# Patient Record
Sex: Female | Born: 1995 | Race: Black or African American | Hispanic: No | Marital: Single | Smoking: Former smoker
Health system: Southern US, Community
[De-identification: ages and names within clinical notes are randomized; demographics above are authoritative.]

## PROBLEM LIST (undated history)

## (undated) ENCOUNTER — Emergency Department (HOSPITAL_COMMUNITY): Payer: No Typology Code available for payment source | Source: Home / Self Care

## (undated) DIAGNOSIS — F419 Anxiety disorder, unspecified: Secondary | ICD-10-CM

## (undated) DIAGNOSIS — F329 Major depressive disorder, single episode, unspecified: Secondary | ICD-10-CM

## (undated) DIAGNOSIS — F32A Depression, unspecified: Secondary | ICD-10-CM

## (undated) HISTORY — PX: WISDOM TOOTH EXTRACTION: SHX21

## (undated) HISTORY — PX: TONSILLECTOMY: SUR1361

---

## 2017-01-14 ENCOUNTER — Ambulatory Visit (INDEPENDENT_AMBULATORY_CARE_PROVIDER_SITE_OTHER): Payer: BLUE CROSS/BLUE SHIELD

## 2017-01-14 ENCOUNTER — Ambulatory Visit (HOSPITAL_COMMUNITY)
Admission: EM | Admit: 2017-01-14 | Discharge: 2017-01-14 | Disposition: A | Discharge status: Home / Self Care | Payer: BLUE CROSS/BLUE SHIELD | Attending: Family Medicine | Admitting: Family Medicine

## 2017-01-14 ENCOUNTER — Encounter (HOSPITAL_COMMUNITY): Payer: Self-pay | Admitting: Emergency Medicine

## 2017-01-14 DIAGNOSIS — S96911A Strain of unspecified muscle and tendon at ankle and foot level, right foot, initial encounter: Secondary | ICD-10-CM | POA: Diagnosis not present

## 2017-01-14 DIAGNOSIS — S93401A Sprain of unspecified ligament of right ankle, initial encounter: Secondary | ICD-10-CM

## 2017-01-14 HISTORY — DX: Anxiety disorder, unspecified: F41.9

## 2017-01-14 HISTORY — DX: Major depressive disorder, single episode, unspecified: F32.9

## 2017-01-14 HISTORY — DX: Depression, unspecified: F32.A

## 2017-01-14 NOTE — ED Triage Notes (Signed)
Pt reports she jammed her 5th digit on right toe yest against a metal baby high chair   Sx include: swelling an dpain  Slow gait... A&O x4... NAD... Ambulatory

## 2017-01-14 NOTE — Discharge Instructions (Signed)
Rest, apply ice to the toe and ankle for the next 2-3 days, wear the wrap to the ankle for 3 or 4 days and limit weightbearing. Keep the little toe buddy taped to the fourth toe for couple of days. No apparent broken bones on x-ray.

## 2017-01-14 NOTE — ED Provider Notes (Signed)
CSN: [id number]     Arrival date & time 01/14/17  1354 History   First MD Initiated Contact with Patient 01/14/17 1410     Chief Complaint  Patient presents with  . Foot Pain   (Consider location/radiation/quality/duration/timing/severity/associated sxs/prior Treatment) 21 year old female was walking in her home last night where she caught her fifth toe on some object and "overstretched it". This caused her to fall twisting her right ankle. She did not have pain to the ankle at that time however several hours later she awoke early in the morning with pain and swelling to the right ankle. She has been ambulating today.      Past Medical History:  Diagnosis Date  . Anxiety   . Depression    History reviewed. No pertinent surgical history. History reviewed. No pertinent family history. Social History  Substance Use Topics  . Smoking status: Current Every Day Smoker    Types: Cigarettes  . Smokeless tobacco: Never Used  . Alcohol use No   OB History    No data available     Review of Systems  Constitutional: Negative.  Negative for activity change, chills and fever.  HENT: Negative.   Respiratory: Negative.   Cardiovascular: Negative.   Musculoskeletal:       As per HPI  Skin: Negative for color change, pallor and rash.  Neurological: Negative.   All other systems reviewed and are negative.   Allergies  Latex  Home Medications   Prior to Admission medications   Not on File   Meds Ordered and Administered this Visit  Medications - No data to display  BP 122/73 (BP Location: Right Arm)   Pulse 77   Temp 98.5 F (36.9 C) (Oral)   Resp 16   SpO2 100%  No data found.   Physical Exam  Constitutional: She is oriented to person, place, and time. She appears well-developed and well-nourished. No distress.  HENT:  Head: Normocephalic and atraumatic.  Eyes: EOM are normal.  Neck: Normal range of motion. Neck supple.  Musculoskeletal: Normal range of motion.   Minor swelling to the ankle primarily medial aspect. Tenderness just distal to the medial malleolus. Minor swelling to the foot. No tenderness to the foot. No deformity. Tenderness to the IP joint of the fifth digit. No deformity or appreciable swelling. Pedal pulse 2+. Distal neurovascular motor sensory is grossly intact. No injury to the skin or apparent wounds.  Lymphadenopathy:    She has no cervical adenopathy.  Neurological: She is alert and oriented to person, place, and time. No cranial nerve deficit.  Skin: Skin is warm and dry.  Psychiatric: She has a normal mood and affect.  Nursing note and vitals reviewed.   Urgent Care Course     Procedures (including critical care time)  Labs Review Labs Reviewed - No data to display  Imaging Review Dg Foot Complete Right  Result Date: 01/14/2017 CLINICAL DATA:  21 year old female status post trip and fall radiating from the right fifth toe to the lateral foot and medial ankle. EXAM: RIGHT FOOT COMPLETE - 3+ VIEW COMPARISON:  None. FINDINGS: Bone mineralization is within normal limits. Calcaneus is intact. Grossly normal alignment at the visible right ankle. Tarsal bones appear intact and normally aligned. Metatarsals appear intact and normally aligned. Phalanges appear intact. No fifth phalanx fracture or dislocation identified. IMPRESSION: No acute fracture or dislocation identified about the right foot. If there is right ankle point tenderness then a dedicated right ankle series is recommended. Electronically  Signed   By: Odessa Fleming M.D.   On: 01/14/2017 14:48     Visual Acuity Review  Right Eye Distance:   Left Eye Distance:   Bilateral Distance:    Right Eye Near:   Left Eye Near:    Bilateral Near:         MDM   1. Sprain of right ankle, unspecified ligament, initial encounter   2. Strain of fifth toe of right foot, initial encounter    Rest, apply ice to the toe and ankle for the next 2-3 days, wear the wrap to the  ankle for 3 or 4 days and limit weightbearing. Keep the little toe buddy taped to the fourth toe for couple of days. No apparent broken bones on x-ray. ACE and toe buddy tape    Hayden Rasmussen, NP 01/14/17 1510

## 2017-12-05 ENCOUNTER — Emergency Department (HOSPITAL_BASED_OUTPATIENT_CLINIC_OR_DEPARTMENT_OTHER)
Admission: EM | Admit: 2017-12-05 | Discharge: 2017-12-06 | Disposition: A | Discharge status: Home / Self Care | Payer: BLUE CROSS/BLUE SHIELD | Attending: Emergency Medicine | Admitting: Emergency Medicine

## 2017-12-05 ENCOUNTER — Other Ambulatory Visit: Payer: Self-pay

## 2017-12-05 ENCOUNTER — Encounter (HOSPITAL_BASED_OUTPATIENT_CLINIC_OR_DEPARTMENT_OTHER): Payer: Self-pay | Admitting: *Deleted

## 2017-12-05 DIAGNOSIS — Y998 Other external cause status: Secondary | ICD-10-CM | POA: Diagnosis not present

## 2017-12-05 DIAGNOSIS — Z5321 Procedure and treatment not carried out due to patient leaving prior to being seen by health care provider: Secondary | ICD-10-CM | POA: Diagnosis not present

## 2017-12-05 DIAGNOSIS — Y92410 Unspecified street and highway as the place of occurrence of the external cause: Secondary | ICD-10-CM | POA: Diagnosis not present

## 2017-12-05 DIAGNOSIS — M549 Dorsalgia, unspecified: Secondary | ICD-10-CM | POA: Insufficient documentation

## 2017-12-05 DIAGNOSIS — Y939 Activity, unspecified: Secondary | ICD-10-CM | POA: Diagnosis not present

## 2017-12-05 DIAGNOSIS — M25511 Pain in right shoulder: Secondary | ICD-10-CM | POA: Diagnosis not present
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
## 2017-12-05 NOTE — ED Notes (Signed)
EDP into room, pt not in room, pt left.

## 2017-12-05 NOTE — ED Notes (Addendum)
Wait, plan, process explained with rationale. Pt speaking of leaving. Encouraged to stay. Pt alert, NAD, calm, interactive, resps e/u, speaking in clear complete sentences, no dyspnea noted.
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
## 2018-06-05 ENCOUNTER — Emergency Department (HOSPITAL_COMMUNITY)
Admission: EM | Admit: 2018-06-05 | Discharge: 2018-06-05 | Disposition: A | Discharge status: Home / Self Care | Payer: BLUE CROSS/BLUE SHIELD | Attending: Emergency Medicine | Admitting: Emergency Medicine

## 2018-06-05 ENCOUNTER — Emergency Department (HOSPITAL_COMMUNITY): Payer: BLUE CROSS/BLUE SHIELD

## 2018-06-05 ENCOUNTER — Other Ambulatory Visit: Payer: Self-pay

## 2018-06-05 ENCOUNTER — Encounter (HOSPITAL_COMMUNITY): Payer: Self-pay

## 2018-06-05 DIAGNOSIS — R0789 Other chest pain: Secondary | ICD-10-CM | POA: Diagnosis not present

## 2018-06-05 DIAGNOSIS — F17210 Nicotine dependence, cigarettes, uncomplicated: Secondary | ICD-10-CM | POA: Insufficient documentation

## 2018-06-05 DIAGNOSIS — R05 Cough: Secondary | ICD-10-CM | POA: Insufficient documentation

## 2018-06-05 DIAGNOSIS — Z91040 Latex allergy status: Secondary | ICD-10-CM | POA: Diagnosis not present

## 2018-06-05 DIAGNOSIS — R0602 Shortness of breath: Secondary | ICD-10-CM | POA: Diagnosis not present

## 2018-06-05 DIAGNOSIS — R079 Chest pain, unspecified: Secondary | ICD-10-CM

## 2018-06-05 LAB — BASIC METABOLIC PANEL
Anion gap: 7 (ref 5–15)
BUN: 11 mg/dL (ref 6–20)
CHLORIDE: 111 mmol/L (ref 98–111)
CO2: 25 mmol/L (ref 22–32)
CREATININE: 0.82 mg/dL (ref 0.44–1.00)
Calcium: 9.5 mg/dL (ref 8.9–10.3)
Glucose, Bld: 118 mg/dL — ABNORMAL HIGH (ref 70–99)
Potassium: 3.9 mmol/L (ref 3.5–5.1)
SODIUM: 143 mmol/L (ref 135–145)

## 2018-06-05 LAB — CBC
HCT: 41.9 % (ref 36.0–46.0)
Hemoglobin: 14.2 g/dL (ref 12.0–15.0)
MCH: 31.3 pg (ref 26.0–34.0)
MCHC: 33.9 g/dL (ref 30.0–36.0)
MCV: 92.5 fL (ref 78.0–100.0)
Platelets: 326 10*3/uL (ref 150–400)
RBC: 4.53 MIL/uL (ref 3.87–5.11)
RDW: 12.9 % (ref 11.5–15.5)
WBC: 11.3 10*3/uL — AB (ref 4.0–10.5)

## 2018-06-05 LAB — POCT I-STAT TROPONIN I: TROPONIN I, POC: 0 ng/mL (ref 0.00–0.08)

## 2018-06-05 LAB — I-STAT BETA HCG BLOOD, ED (NOT ORDERABLE)

## 2018-06-05 MED ORDER — KETOROLAC TROMETHAMINE 60 MG/2ML IM SOLN
60.0000 mg | Freq: Once | INTRAMUSCULAR | Status: AC
  Administered 2018-06-05: 60 mg via INTRAMUSCULAR
  Filled 2018-06-05: qty 2

## 2018-06-05 MED ORDER — IBUPROFEN 800 MG PO TABS: 800.0000 mg | ORAL_TABLET | Freq: Three times a day (TID) | ORAL | 0 refills | Status: DC

## 2018-06-05 NOTE — ED Triage Notes (Signed)
Pt states that she has been having chest pain since last night "all over her chest. Pt describes pain as "achy". Pt denies musculoskeletal issues.

## 2018-06-05 NOTE — ED Provider Notes (Signed)
Emergency Department Provider Note   I have reviewed the triage vital signs and the nursing notes.   HISTORY  Chief Complaint Chest Pain   HPI Abigail Erickson is a 22 y.o. female with left sided chest pain with some sob. No fever. Some cough. Is a smoker. No trauma. No rash. No h/o same. Achy in nature. Started yesterday. Intermittent.   No other associated or modifying symptoms.    Past Medical History:  Diagnosis Date  . Anxiety   . Depression     There are no active problems to display for this patient.   Past Surgical History:  Procedure Laterality Date  . WISDOM TOOTH EXTRACTION        Allergies Latex  No family history on file.  Social History Social History   Tobacco Use  . Smoking status: Current Every Day Smoker    Types: Cigarettes  . Smokeless tobacco: Never Used  Substance Use Topics  . Alcohol use: Not Currently  . Drug use: No    Review of Systems  All other systems negative except as documented in the HPI. All pertinent positives and negatives as reviewed in the HPI. ____________________________________________   PHYSICAL EXAM:  VITAL SIGNS: ED Triage Vitals  Enc Vitals Group     BP 06/05/18 1810 132/82     Pulse Rate 06/05/18 1810 75     Resp 06/05/18 1810 18     Temp 06/05/18 1810 98.5 F (36.9 C)     Temp Source 06/05/18 1810 Oral     SpO2 06/05/18 1810 100 %     Weight 06/05/18 1810 195 lb (88.5 kg)     Height 06/05/18 1810 5\' 2"  (1.575 m)    Constitutional: Alert and oriented. Well appearing and in no acute distress. Eyes: Conjunctivae are normal. PERRL. EOMI. Head: Atraumatic. Nose: No congestion/rhinnorhea. Mouth/Throat: Mucous membranes are moist.  Oropharynx non-erythematous. Neck: No stridor.  No meningeal signs.   Cardiovascular: Normal rate, regular rhythm. Good peripheral circulation. Grossly normal heart sounds.   Respiratory: Normal respiratory effort.  No retractions. Lungs CTAB. Gastrointestinal: Soft  and nontender. No distention.  Musculoskeletal: No lower extremity tenderness nor edema. No gross deformities of extremities. Neurologic:  Normal speech and language. No gross focal neurologic deficits are appreciated.  Skin:  Skin is warm, dry and intact. No rash noted.  ____________________________________________   LABS (all labs ordered are listed, but only abnormal results are displayed)  Labs Reviewed  BASIC METABOLIC PANEL - Abnormal; Notable for the following components:      Result Value   Glucose, Bld 118 (*)    All other components within normal limits  CBC - Abnormal; Notable for the following components:   WBC 11.3 (*)    All other components within normal limits  I-STAT TROPONIN, ED  I-STAT BETA HCG BLOOD, ED ([facility], [facility], [facility] ONLY)  POCT I-STAT TROPONIN I  I-STAT BETA HCG BLOOD, ED (NOT ORDERABLE)   ____________________________________________  EKG   EKG Interpretation  Date/Time:  Saturday June 05 2018 20:49:19 EDT Ventricular Rate:  68 PR Interval:  64 QRS Duration: 79 QT Interval:  387 QTC Calculation: 412 R Axis:   68 Text Interpretation:  Sinus rhythm No significant change since last tracing Confirmed by Marily Memos [phone] on 06/05/2018 9:43:52 PM      ____________________________________________  RADIOLOGY  Dg Chest 2 View  Result Date: 06/05/2018 CLINICAL DATA:  Chest pain EXAM: CHEST - 2 VIEW COMPARISON:  None. FINDINGS: The heart size and  mediastinal contours are within normal limits. Both lungs are clear. The visualized skeletal structures are unremarkable. IMPRESSION: No active cardiopulmonary disease. Electronically Signed   By: Kennith Center M.D.   On: 06/05/2018 18:47    ____________________________________________   INITIAL IMPRESSION / ASSESSMENT AND PLAN / ED COURSE  Unclear etiology of her symptoms however I think is unlikely to be PE as she is low risk and PERC negative.  Unlikely to be ACS.  No evidence of pneumonia or rash to be  worried about those.  No trauma.  No evidence of collapsed lung on x-ray.  Possibly costochondritis versus muscle sprain.  Pertinent labs & imaging results that were available during my care of the patient were reviewed by me and considered in my medical decision making (see chart for details).  ____________________________________________  FINAL CLINICAL IMPRESSION(S) / ED DIAGNOSES  Final diagnoses:  Nonspecific chest pain     MEDICATIONS GIVEN DURING THIS VISIT:  Medications  ketorolac (TORADOL) injection 60 mg (60 mg Intramuscular Given 06/05/18 2113)     NEW OUTPATIENT MEDICATIONS STARTED DURING THIS VISIT:  Discharge Medication List as of 06/05/2018  9:08 PM    START taking these medications   Details  ibuprofen (ADVIL,MOTRIN) 800 MG tablet Take 1 tablet (800 mg total) by mouth 3 (three) times daily., Starting Sat 06/05/2018, Normal        Note:  This note was prepared with assistance of Dragon voice recognition software. Occasional wrong-word or sound-a-like substitutions may have occurred due to the inherent limitations of voice recognition software.   Marily Memos, MD 06/05/18 2144

## 2018-08-02 ENCOUNTER — Encounter (HOSPITAL_BASED_OUTPATIENT_CLINIC_OR_DEPARTMENT_OTHER): Payer: Self-pay | Admitting: *Deleted

## 2018-08-02 ENCOUNTER — Other Ambulatory Visit: Payer: Self-pay

## 2018-08-02 ENCOUNTER — Emergency Department (HOSPITAL_BASED_OUTPATIENT_CLINIC_OR_DEPARTMENT_OTHER)
Admission: EM | Admit: 2018-08-02 | Discharge: 2018-08-02 | Disposition: A | Discharge status: Home / Self Care | Payer: BLUE CROSS/BLUE SHIELD | Attending: Emergency Medicine | Admitting: Emergency Medicine

## 2018-08-02 DIAGNOSIS — F329 Major depressive disorder, single episode, unspecified: Secondary | ICD-10-CM | POA: Insufficient documentation

## 2018-08-02 DIAGNOSIS — M5441 Lumbago with sciatica, right side: Secondary | ICD-10-CM | POA: Diagnosis not present

## 2018-08-02 DIAGNOSIS — Z91040 Latex allergy status: Secondary | ICD-10-CM | POA: Diagnosis not present

## 2018-08-02 DIAGNOSIS — F419 Anxiety disorder, unspecified: Secondary | ICD-10-CM | POA: Insufficient documentation

## 2018-08-02 DIAGNOSIS — F17210 Nicotine dependence, cigarettes, uncomplicated: Secondary | ICD-10-CM | POA: Diagnosis not present

## 2018-08-02 DIAGNOSIS — M545 Low back pain: Secondary | ICD-10-CM | POA: Diagnosis present

## 2018-08-02 LAB — URINALYSIS, ROUTINE W REFLEX MICROSCOPIC
BILIRUBIN URINE: NEGATIVE
GLUCOSE, UA: NEGATIVE mg/dL
Ketones, ur: 15 mg/dL — AB
Nitrite: NEGATIVE
PH: 7 (ref 5.0–8.0)
Protein, ur: NEGATIVE mg/dL
Specific Gravity, Urine: 1.02 (ref 1.005–1.030)

## 2018-08-02 LAB — URINALYSIS, MICROSCOPIC (REFLEX)

## 2018-08-02 LAB — PREGNANCY, URINE: Preg Test, Ur: NEGATIVE

## 2018-08-02 MED ORDER — ORPHENADRINE CITRATE ER 100 MG PO TB12: 100.0000 mg | ORAL_TABLET | Freq: Two times a day (BID) | ORAL | 0 refills | Status: DC

## 2018-08-02 MED ORDER — NAPROXEN 500 MG PO TABS: 500.0000 mg | ORAL_TABLET | Freq: Two times a day (BID) | ORAL | 0 refills | Status: DC

## 2018-08-02 MED ORDER — IBUPROFEN 800 MG PO TABS
800.0000 mg | ORAL_TABLET | Freq: Once | ORAL | Status: AC
  Administered 2018-08-02: 800 mg via ORAL
  Filled 2018-08-02: qty 1

## 2018-08-02 MED ORDER — CYCLOBENZAPRINE HCL 10 MG PO TABS
10.0000 mg | ORAL_TABLET | Freq: Once | ORAL | Status: AC
  Administered 2018-08-02: 10 mg via ORAL
  Filled 2018-08-02: qty 1

## 2018-08-02 MED ORDER — HYDROCODONE-ACETAMINOPHEN 5-325 MG PO TABS: 1.0000 | ORAL_TABLET | ORAL | 0 refills | Status: AC | PRN

## 2018-08-02 NOTE — ED Provider Notes (Signed)
[facility] EMERGENCY DEPARTMENT Provider Note   CSN: [id number] Arrival date & time: 08/02/18  2109     History   Chief Complaint Chief Complaint  Patient presents with  . Back Pain    HPI Abigail Erickson is a 22 y.o. female.  The history is provided by the patient.  She has history of anxiety and depression and comes in with onset 2 days ago of right-sided lower back pain which radiates down her right leg to her knee.  Pain is dull and she rates it at 8/10.  Is worse with walking, better if she lays down and puts heat on it.  She has taken ibuprofen with little relief.  She denies any recent trauma, but her job does involve a lot of lifting.  She has had back pain in the past.  She denies any bowel or bladder dysfunction.  She denies any weakness, numbness, tingling.  Past Medical History:  Diagnosis Date  . Anxiety   . Depression     There are no active problems to display for this patient.   Past Surgical History:  Procedure Laterality Date  . WISDOM TOOTH EXTRACTION       OB History   None      Home Medications    Prior to Admission medications   Medication Sig Start Date End Date Taking? Authorizing Provider  ibuprofen (ADVIL,MOTRIN) 800 MG tablet Take 1 tablet (800 mg total) by mouth 3 (three) times daily. 06/05/18   Mesner, Barbara Cower, MD    Family History No family history on file.  Social History Social History   Tobacco Use  . Smoking status: Current Every Day Smoker    Types: Cigarettes  . Smokeless tobacco: Never Used  Substance Use Topics  . Alcohol use: Not Currently  . Drug use: No     Allergies   Latex   Review of Systems Review of Systems  All other systems reviewed and are negative.    Physical Exam Updated Vital Signs BP 112/75   Pulse 88   Temp 98.7 F (37.1 C) (Oral)   Resp 16   Ht 5\' 1"  (1.549 m)   Wt 88.5 kg   LMP 07/30/2018   SpO2 97%   BMI 36.84 kg/m   Physical Exam  Nursing note and vitals  reviewed.  22 year old female, resting comfortably and in no acute distress. Vital signs are normal. Oxygen saturation is 97%, which is normal. Head is normocephalic and atraumatic. PERRLA, EOMI. Oropharynx is clear. Neck is nontender and supple without adenopathy or JVD. Back has moderate tenderness in the upper and lower lumbar area.  There is moderate bilateral paralumbar spasm which is worse on the right.  There is moderate bilateral lumbar tenderness.  Straight leg raise is positive on the right at 30 degrees.  There is no CVA tenderness. Lungs are clear without rales, wheezes, or rhonchi. Chest is nontender. Heart has regular rate and rhythm without murmur. Abdomen is soft, flat, nontender without masses or hepatosplenomegaly and peristalsis is normoactive. Extremities have no cyanosis or edema, full range of motion is present. Skin is warm and dry without rash. Neurologic: Mental status is normal, cranial nerves are intact, there are no motor or sensory deficits.  ED Treatments / Results  Labs (all labs ordered are listed, but only abnormal results are displayed) Labs Reviewed  URINALYSIS, ROUTINE W REFLEX MICROSCOPIC - Abnormal; Notable for the following components:      Result Value   APPearance  HAZY (*)    Hgb urine dipstick LARGE (*)    Ketones, ur 15 (*)    Leukocytes, UA SMALL (*)    All other components within normal limits  URINALYSIS, MICROSCOPIC (REFLEX) - Abnormal; Notable for the following components:   Bacteria, UA MANY (*)    All other components within normal limits  PREGNANCY, URINE   Procedures Procedures   Medications Ordered in ED Medications - No data to display   Initial Impression / Assessment and Plan / ED Course  I have reviewed the triage vital signs and the nursing notes.  Pertinent lab results that were available during my care of the patient were reviewed by me and considered in my medical decision making (see chart for  details).  Right-sided sciatica.  No red flags to suggest anything more than musculoskeletal pain.  Old records are reviewed, and she has no relevant past visits.  Urinalysis does have many bacteria, but is a contaminated specimen.  She has no symptoms to suggest UTI.  She is discharged with prescriptions for naproxen, orphenadrine, and a small number of hydrocodone-acetaminophen tablets.  Given work release for limited lifting for the next 7 days.  Referred back to PCP for follow-up.  Final Clinical Impressions(s) / ED Diagnoses   Final diagnoses:  None    ED Discharge Orders    None       Dione BoozeGlick, Atha Mcbain, MD 08/02/18 2322

## 2018-08-02 NOTE — Discharge Instructions (Signed)
Apply ice for thirty minutes, 3-4 times a day.  Take acetaminophen as needed for additional pain relief (do not take within four hours of taking a dose of hydrocodone-acetaminophen).

## 2018-08-02 NOTE — ED Notes (Signed)
Blood noted on urinalysis. Pt is on her menses day 4.

## 2018-08-02 NOTE — ED Triage Notes (Signed)
Lower back pain after working 2 night ago. Pain radiates down her right leg. She is ambulatory.

## 2019-05-27 ENCOUNTER — Encounter (HOSPITAL_COMMUNITY): Payer: Self-pay

## 2019-05-27 ENCOUNTER — Other Ambulatory Visit: Payer: Self-pay

## 2019-05-27 ENCOUNTER — Emergency Department (HOSPITAL_COMMUNITY)
Admission: EM | Admit: 2019-05-27 | Discharge: 2019-05-27 | Disposition: A | Discharge status: Home / Self Care | Payer: BC Managed Care – PPO | Attending: Emergency Medicine | Admitting: Emergency Medicine

## 2019-05-27 DIAGNOSIS — R202 Paresthesia of skin: Secondary | ICD-10-CM | POA: Insufficient documentation

## 2019-05-27 DIAGNOSIS — R200 Anesthesia of skin: Secondary | ICD-10-CM | POA: Diagnosis not present

## 2019-05-27 DIAGNOSIS — M5416 Radiculopathy, lumbar region: Secondary | ICD-10-CM | POA: Diagnosis not present

## 2019-05-27 DIAGNOSIS — M79604 Pain in right leg: Secondary | ICD-10-CM | POA: Diagnosis present

## 2019-05-27 DIAGNOSIS — F17210 Nicotine dependence, cigarettes, uncomplicated: Secondary | ICD-10-CM | POA: Diagnosis not present

## 2019-05-27 LAB — BASIC METABOLIC PANEL
Anion gap: 9 (ref 5–15)
BUN: 9 mg/dL (ref 6–20)
CO2: 22 mmol/L (ref 22–32)
Calcium: 9.1 mg/dL (ref 8.9–10.3)
Chloride: 107 mmol/L (ref 98–111)
Creatinine, Ser: 0.83 mg/dL (ref 0.44–1.00)
GFR calc Af Amer: >60 mL/min (ref >60)
GFR calc non Af Amer: >60 mL/min (ref >60)
Glucose, Bld: 90 mg/dL (ref 70–99)
Potassium: 3.8 mmol/L (ref 3.5–5.1)
Sodium: 138 mmol/L (ref 135–145)

## 2019-05-27 LAB — CBC
HCT: 42.4 % (ref 36.0–46.0)
Hemoglobin: 14.2 g/dL (ref 12.0–15.0)
MCH: 32.1 pg (ref 26.0–34.0)
MCHC: 33.5 g/dL (ref 30.0–36.0)
MCV: 95.9 fL (ref 80.0–100.0)
Platelets: 230 10*3/uL (ref 150–400)
RBC: 4.42 MIL/uL (ref 3.87–5.11)
RDW: 12.2 % (ref 11.5–15.5)
WBC: 11.9 10*3/uL — ABNORMAL HIGH (ref 4.0–10.5)
nRBC: 0 % (ref 0.0–0.2)

## 2019-05-27 MED ORDER — PREDNISONE 20 MG PO TABS: 40.0000 mg | ORAL_TABLET | Freq: Every day | ORAL | 0 refills | Status: AC

## 2019-05-27 NOTE — Discharge Instructions (Signed)
Return emergency department if your symptoms worsen.  This could include urinating or defecating on yourself without control.  Inability to stand or balance.  Redness of the leg or fever.  Otherwise, please follow-up with your doctor.  If your symptoms do not start to improve with the prednisone, you may need to be seen by a back specialist or a neurologist.  I have provided contact information for you.  Please not hesitate to return for any symptoms you find concerning.

## 2019-05-27 NOTE — ED Provider Notes (Signed)
[place] EMERGENCY DEPARTMENT Provider Note   CSN: [id number] Arrival date & time: 05/27/19  1950     History   Chief Complaint Chief Complaint  Patient presents with  . Leg Pain    HPI Abigail Erickson is a 23 y.o. female.     Patient presents to the emergency department with a chief complaint of right leg pain, numbness, and tingling.  She states that symptoms started yesterday.  It gradually worsened today.  She was seen by an outside facility, and had a negative DVT study.  She states that she did have some leg swelling.  She denies any redness of the leg.  She has a family history of blood clots, but has never had 1 personally.  She denies any chest pain, shortness of breath, or syncopal episodes.  She does complain of pain in the low back.  The pain and symptoms only involve the right leg.  She has not tried any treatments.  The history is provided by the patient. No language interpreter was used.    Past Medical History:  Diagnosis Date  . Anxiety   . Depression     There are no active problems to display for this patient.   Past Surgical History:  Procedure Laterality Date  . WISDOM TOOTH EXTRACTION       OB History   No obstetric history on file.      Home Medications    Prior to Admission medications   Medication Sig Start Date End Date Taking? Authorizing Provider  HYDROcodone-acetaminophen (NORCO) 5-325 MG tablet Take 1 tablet by mouth every 4 (four) hours as needed for moderate pain. 37/1/06   Delora Fuel, MD  naproxen (NAPROSYN) 500 MG tablet Take 1 tablet (500 mg total) by mouth 2 (two) times daily. 26/9/48   Delora Fuel, MD  orphenadrine (NORFLEX) 100 MG tablet Take 1 tablet (100 mg total) by mouth 2 (two) times daily. 54/6/27   Delora Fuel, MD    Family History No family history on file.  Social History Social History   Tobacco Use  . Smoking status: Current Every Day Smoker    Types: Cigarettes  . Smokeless tobacco:  Never Used  Substance Use Topics  . Alcohol use: Not Currently  . Drug use: No     Allergies   Latex   Review of Systems Review of Systems  All other systems reviewed and are negative.    Physical Exam Updated Vital Signs BP 119/65   Pulse 64   Temp 98.6 F (37 C) (Oral)   Resp 16   SpO2 100%   Physical Exam  Physical Exam  Constitutional: Pt appears well-developed and well-nourished. No distress.  HENT:  Head: Normocephalic and atraumatic.  Mouth/Throat: Oropharynx is clear and moist. No oropharyngeal exudate.  Eyes: Conjunctivae are normal.  Neck: Normal range of motion. Neck supple.  No meningismus Cardiovascular: Normal rate, regular rhythm and intact distal pulses.   Pulmonary/Chest: Effort normal and breath sounds normal. No respiratory distress. Pt has no wheezes.  Abdominal: Pt exhibits no distension Musculoskeletal:   Right lumbar paraspinal muscles tender to palpation, no bony CTLS spine tenderness, deformity, step-off, or crepitus Lymphadenopathy: Pt has no cervical adenopathy.  Neurological: Pt is alert and oriented Speech is clear and goal oriented, follows commands Normal 5/5 strength in upper and lower extremities bilaterally including dorsiflexion and plantar flexion, strong and equal grip strength Sensation intact Great toe extension intact Moves extremities without ataxia, coordination intact Ankle and  knee jerk reflexes intact and symmetrical  Normal gait Normal balance No Clonus Skin: Skin is warm and dry. No rash noted. Pt is not diaphoretic. No erythema.  Psychiatric: Pt has a normal mood and affect. Behavior is normal.  Nursing note and vitals reviewed.  ED Treatments / Results  Labs (all labs ordered are listed, but only abnormal results are displayed) Labs Reviewed  CBC - Abnormal; Notable for the following components:      Result Value   WBC 11.9 (*)    All other components within normal limits  BASIC METABOLIC PANEL    EKG  None  Radiology No results found.  Procedures Procedures (including critical care time)  Medications Ordered in ED Medications - No data to display   Initial Impression / Assessment and Plan / ED Course  I have reviewed the triage vital signs and the nursing notes.  Pertinent labs & imaging results that were available during my care of the patient were reviewed by me and considered in my medical decision making (see chart for details).        Patient with unilateral subjective numbness and tingling.  She does have good strength in the right lower extremity.  Her symptoms are reproducible with palpation of the low back on the right side.  Her symptoms seem to be more consistent with radiculopathy.  She has strong pulses and good sensation objectively.  She had a negative DVT study and an outside facility.  I doubt DVT.  Will start patient on prednisone for radiculopathy.  Discussed return precautions.  Patient did inquire about CT scan of her leg.  I do not feel that this is indicated at this time, given that she had a negative DVT study, and objectively has normal sensation and strength with good pulses.  Final Clinical Impressions(s) / ED Diagnoses   Final diagnoses:  Lumbar radiculopathy    ED Discharge Orders         Ordered    predniSONE (DELTASONE) 20 MG tablet  Daily     05/27/19 2318           Roxy Horseman, PA-C 05/27/19 2318    Melene Plan, DO 05/27/19 2319

## 2019-05-27 NOTE — ED Triage Notes (Signed)
Pt reports tingling in right foot that started earlier this week and now she is having leg cramping and pain that radiates up to her hip. Pt seen at [facility] to rule out a blood clot. No swelling or redness noted to leg. Pt ambulatory.

## 2019-09-04 ENCOUNTER — Other Ambulatory Visit: Payer: Self-pay

## 2019-09-04 ENCOUNTER — Emergency Department (HOSPITAL_COMMUNITY): Payer: BC Managed Care – PPO

## 2019-09-04 ENCOUNTER — Emergency Department (HOSPITAL_COMMUNITY)
Admission: EM | Admit: 2019-09-04 | Discharge: 2019-09-04 | Disposition: A | Discharge status: Home / Self Care | Payer: BC Managed Care – PPO | Attending: Emergency Medicine | Admitting: Emergency Medicine

## 2019-09-04 ENCOUNTER — Encounter (HOSPITAL_COMMUNITY): Payer: Self-pay | Admitting: Emergency Medicine

## 2019-09-04 DIAGNOSIS — Z91040 Latex allergy status: Secondary | ICD-10-CM | POA: Insufficient documentation

## 2019-09-04 DIAGNOSIS — M25561 Pain in right knee: Secondary | ICD-10-CM | POA: Diagnosis present

## 2019-09-04 DIAGNOSIS — Z79899 Other long term (current) drug therapy: Secondary | ICD-10-CM | POA: Insufficient documentation

## 2019-09-04 DIAGNOSIS — F17210 Nicotine dependence, cigarettes, uncomplicated: Secondary | ICD-10-CM | POA: Insufficient documentation

## 2019-09-04 MED ORDER — ACETAMINOPHEN 325 MG PO TABS: 650.0000 mg | ORAL_TABLET | Freq: Once | ORAL | Status: DC

## 2019-09-04 NOTE — ED Notes (Signed)
Pt verbalizes understanding of DC instructions. Pt belongings returned and is ambulatory out of ED.  

## 2019-09-04 NOTE — Discharge Instructions (Signed)
Tylenol and ibuprofen as needed for pain.  Do not take more than 4000 mg Tylenol or more than 2400 mg ibuprofen daily.  Will need to present to emergency department again tomorrow to have an ultrasound to rule out a blood clot of your leg.  This is negative you need follow-up with orthopedics for reevaluation.  I would suggest ice, elevation of the extremity.

## 2019-09-04 NOTE — ED Provider Notes (Signed)
[facility] DEPT Provider Note   CSN: [id number] Arrival date & time: 09/04/19  1721    History Chief Complaint  Patient presents with  . Knee Pain    Abigail Erickson is a 24 y.o. female with past medical history presents for evaluation of knee pain.  Patient states she has noticed some pain and swelling to the posterior aspect of her right knee.  Patient states today she did hit her knee and then she has pain to the anterior aspect of her right knee.  Has had some difficulty walking since the injury.  Denies hitting head, LOC.  She recently stopped taking OCPs 4 weeks ago.  She denies any chest pain, shortness of breath.  She has noticed some mild swelling to her right knee.  Urinary symptoms, vaginal discharge, concerns for STDs.  Denies fever, chills, nausea, vomiting, chest pain shortness of breath, abdominal pain, dysuria, redness, warmth to lower extremities.  No rashes or lesions.  She rates her current pain a 5/10.  Denies additional aggravating relieving factors.  No prior history of PE or DVT.  History obtained from patient and past medical. No interpreter is used.  HPI     Past Medical History:  Diagnosis Date  . Anxiety   . Depression     There are no problems to display for this patient.   Past Surgical History:  Procedure Laterality Date  . WISDOM TOOTH EXTRACTION       OB History   No obstetric history on file.     No family history on file.  Social History   Tobacco Use  . Smoking status: Current Every Day Smoker    Types: Cigarettes  . Smokeless tobacco: Never Used  Substance Use Topics  . Alcohol use: Not Currently  . Drug use: No    Home Medications Prior to Admission medications   Medication Sig Start Date End Date Taking? Authorizing Provider  HYDROcodone-acetaminophen (NORCO) 5-325 MG tablet Take 1 tablet by mouth every 4 (four) hours as needed for moderate pain. 93/7/90   Delora Fuel, MD  naproxen (NAPROSYN)  500 MG tablet Take 1 tablet (500 mg total) by mouth 2 (two) times daily. 24/0/97   Delora Fuel, MD  orphenadrine (NORFLEX) 100 MG tablet Take 1 tablet (100 mg total) by mouth 2 (two) times daily. 35/3/29   Delora Fuel, MD  predniSONE (DELTASONE) 20 MG tablet Take 2 tablets (40 mg total) by mouth daily. Take 40 mg by mouth daily for 3 days, then 20mg  by mouth daily for 3 days, then 10mg  daily for 3 days 05/27/19   Montine Circle, PA-C    Allergies    Latex  Review of Systems   Review of Systems  Constitutional: Negative.   HENT: Negative.   Respiratory: Negative.   Cardiovascular: Negative.   Gastrointestinal: Negative.   Genitourinary: Negative.   Musculoskeletal: Positive for gait problem (Limp to right knee).       Right knee pain  Skin: Negative.   All other systems reviewed and are negative.   Physical Exam Updated Vital Signs BP 106/76   Pulse 64   Temp 99.1 F (37.3 C) (Oral)   Resp 18   LMP 08/06/2019   SpO2 100%   Physical Exam Vitals and nursing note reviewed.  Constitutional:      General: She is not in acute distress.    Appearance: She is well-developed. She is not ill-appearing or toxic-appearing.  HENT:     Head: Normocephalic  and atraumatic.     Nose: Nose normal.  Eyes:     Pupils: Pupils are equal, round, and reactive to light.  Cardiovascular:     Rate and Rhythm: Normal rate.  Pulmonary:     Effort: Pulmonary effort is normal. No respiratory distress.     Breath sounds: Normal breath sounds.  Abdominal:     General: Bowel sounds are normal. There is no distension.  Musculoskeletal:        General: Normal range of motion.     Cervical back: Normal range of motion.     Comments: Tenderness to palpation to anterior right patella as well as right popliteal fossa.  Full range of motion without difficulty.  Negative varus, valgus stress.  Negative anterior drawer.  Compartments soft.  2+ DP, PT pulses bilaterally.  No effusion.  Skin:    General:  Skin is warm and dry.     Capillary Refill: Capillary refill takes less than 2 seconds.     Comments: Brisk capillary refill.  There is some very minimal swelling to her right anterior and posterior knee.  No redness, warmth.  No rashes or lesions.  No hives effusion.  Neurological:     Mental Status: She is alert.     Comments: Limp to right knee.  Full equal strength bilaterally    ED Results / Procedures / Treatments   Labs (all labs ordered are listed, but only abnormal results are displayed) Labs Reviewed - No data to display  EKG None  Radiology DG Knee Complete 4 Views Right  Result Date: 09/04/2019 CLINICAL DATA:  Knee pain EXAM: RIGHT KNEE - COMPLETE 4+ VIEW COMPARISON:  None. FINDINGS: No evidence of fracture, dislocation, or joint effusion. No evidence of arthropathy or other focal bone abnormality. Soft tissues are unremarkable. IMPRESSION: Negative. Electronically Signed   By: Katherine Mantle M.D.   On: 09/04/2019 18:17   Procedures Procedures (including critical care time)  Medications Ordered in ED Medications  acetaminophen (TYLENOL) tablet 650 mg (has no administration in time range)    ED Course  I have reviewed the triage vital signs and the nursing notes.  Pertinent labs & imaging results that were available during my care of the patient were reviewed by me and considered in my medical decision making (see chart for details).  24 year old female presents for evaluation of right knee pain.  She is afebrile, nonseptic, not ill-appearing.  She has full range motion however does have tenderness to her right anterior knee over the patella and in the right popliteal fossa.  Her compartments are soft.  No tenderness, redness or warmth to bilateral calves.  Neurovascularly intact.  Negative varus, valgus stress.  Negative anterior drawer.  She does have ambulation with limp to right knee.  Plain film does not show any fracture, dislocation or effusion.  I have low  suspicion for septic joint, gout, hemarthrosis, compartment syndrome, acute bacterial infectious process.  We will do crutches, knee immobilizer.  She was on OCPs however she denies any chest pain, shortness of breath she is without tachycardia, tachypnea or hypoxia.  We will do outpatient ultrasound to rule out DVT given pain in popliteal fossa.  If outpatient ultrasound negative, RICE for symptomatic management.  She will need to follow-up outpatient with orthopedics for evaluation of her symptoms not resolved or return to the ED for new or worsening symptoms.  The patient has been appropriately medically screened and/or stabilized in the ED. I have low suspicion for  any other emergent medical condition which would require further screening, evaluation or treatment in the ED or require inpatient management.    MDM Rules/Calculators/A&P                       Final Clinical Impression(s) / ED Diagnoses Final diagnoses:  Acute pain of right knee    Rx / DC Orders ED Discharge Orders         Ordered    LE [facility]ENOUS     09/04/19 2121           Mikiala Fugett A, PA-C 09/04/19 2123    Tilden Fossa, MD 09/05/19 1046

## 2019-09-04 NOTE — ED Triage Notes (Signed)
Per pt, states she fell last night landing of right knee-increased pain and swelling

## 2019-09-05 ENCOUNTER — Ambulatory Visit (HOSPITAL_COMMUNITY)
Admission: RE | Admit: 2019-09-05 | Discharge: 2019-09-05 | Disposition: A | Discharge status: Home / Self Care | Payer: BC Managed Care – PPO | Source: Ambulatory Visit | Attending: Physician Assistant | Admitting: Physician Assistant

## 2019-09-05 ENCOUNTER — Other Ambulatory Visit: Payer: Self-pay

## 2019-09-05 DIAGNOSIS — M7989 Other specified soft tissue disorders: Secondary | ICD-10-CM | POA: Diagnosis present

## 2019-09-05 DIAGNOSIS — R609 Edema, unspecified: Secondary | ICD-10-CM | POA: Diagnosis not present

## 2020-04-02 ENCOUNTER — Encounter (HOSPITAL_BASED_OUTPATIENT_CLINIC_OR_DEPARTMENT_OTHER): Payer: Self-pay

## 2020-04-02 ENCOUNTER — Other Ambulatory Visit: Payer: Self-pay

## 2020-04-02 ENCOUNTER — Emergency Department (HOSPITAL_BASED_OUTPATIENT_CLINIC_OR_DEPARTMENT_OTHER)
Admission: EM | Admit: 2020-04-02 | Discharge: 2020-04-02 | Disposition: A | Discharge status: Home / Self Care | Payer: BC Managed Care – PPO | Attending: Emergency Medicine | Admitting: Emergency Medicine

## 2020-04-02 DIAGNOSIS — T148XXA Other injury of unspecified body region, initial encounter: Secondary | ICD-10-CM | POA: Diagnosis not present

## 2020-04-02 DIAGNOSIS — M545 Low back pain: Secondary | ICD-10-CM | POA: Diagnosis not present

## 2020-04-02 DIAGNOSIS — X58XXXA Exposure to other specified factors, initial encounter: Secondary | ICD-10-CM | POA: Insufficient documentation

## 2020-04-02 DIAGNOSIS — Y998 Other external cause status: Secondary | ICD-10-CM | POA: Diagnosis not present

## 2020-04-02 DIAGNOSIS — Y9289 Other specified places as the place of occurrence of the external cause: Secondary | ICD-10-CM | POA: Diagnosis not present

## 2020-04-02 DIAGNOSIS — Y9301 Activity, walking, marching and hiking: Secondary | ICD-10-CM | POA: Insufficient documentation

## 2020-04-02 DIAGNOSIS — Z87891 Personal history of nicotine dependence: Secondary | ICD-10-CM | POA: Diagnosis not present

## 2020-04-02 DIAGNOSIS — R0781 Pleurodynia: Secondary | ICD-10-CM | POA: Diagnosis present

## 2020-04-02 DIAGNOSIS — M25552 Pain in left hip: Secondary | ICD-10-CM | POA: Diagnosis not present

## 2020-04-02 LAB — COMPREHENSIVE METABOLIC PANEL
ALT: 12 U/L (ref 0–44)
AST: 16 U/L (ref 15–41)
Albumin: 4.1 g/dL (ref 3.5–5.0)
Alkaline Phosphatase: 50 U/L (ref 38–126)
Anion gap: 10 (ref 5–15)
BUN: 9 mg/dL (ref 6–20)
CO2: 25 mmol/L (ref 22–32)
Calcium: 9.2 mg/dL (ref 8.9–10.3)
Chloride: 102 mmol/L (ref 98–111)
Creatinine, Ser: 0.75 mg/dL (ref 0.44–1.00)
GFR calc Af Amer: >60 mL/min (ref >60)
GFR calc non Af Amer: >60 mL/min (ref >60)
Glucose, Bld: 91 mg/dL (ref 70–99)
Potassium: 4.5 mmol/L (ref 3.5–5.1)
Sodium: 137 mmol/L (ref 135–145)
Total Bilirubin: 0.5 mg/dL (ref 0.3–1.2)
Total Protein: 7.6 g/dL (ref 6.5–8.1)

## 2020-04-02 LAB — URINALYSIS, ROUTINE W REFLEX MICROSCOPIC
Bilirubin Urine: NEGATIVE
Glucose, UA: NEGATIVE mg/dL
Hgb urine dipstick: NEGATIVE
Ketones, ur: NEGATIVE mg/dL
Leukocytes,Ua: NEGATIVE
Nitrite: NEGATIVE
Protein, ur: NEGATIVE mg/dL
Specific Gravity, Urine: 1.015 (ref 1.005–1.030)
pH: 7 (ref 5.0–8.0)

## 2020-04-02 LAB — CBC
HCT: 41.6 % (ref 36.0–46.0)
Hemoglobin: 14.1 g/dL (ref 12.0–15.0)
MCH: 32.5 pg (ref 26.0–34.0)
MCHC: 33.9 g/dL (ref 30.0–36.0)
MCV: 95.9 fL (ref 80.0–100.0)
Platelets: 262 10*3/uL (ref 150–400)
RBC: 4.34 MIL/uL (ref 3.87–5.11)
RDW: 12.8 % (ref 11.5–15.5)
WBC: 10.1 10*3/uL (ref 4.0–10.5)
nRBC: 0 % (ref 0.0–0.2)

## 2020-04-02 LAB — PREGNANCY, URINE: Preg Test, Ur: NEGATIVE

## 2020-04-02 LAB — LIPASE, BLOOD: Lipase: 26 U/L (ref 11–51)

## 2020-04-02 MED ORDER — SODIUM CHLORIDE 0.9% FLUSH
3.0000 mL | Freq: Once | INTRAVENOUS | Status: DC
  Filled 2020-04-02: qty 3

## 2020-04-02 MED ORDER — IBUPROFEN 800 MG PO TABS
800.0000 mg | ORAL_TABLET | Freq: Once | ORAL | Status: AC
  Administered 2020-04-02: 800 mg via ORAL
  Filled 2020-04-02: qty 1

## 2020-04-02 NOTE — ED Notes (Signed)
AVS reviewed with pt, discussed pain control, management when at home. Copy of AVS provided

## 2020-04-02 NOTE — ED Provider Notes (Signed)
[facility] EMERGENCY DEPARTMENT Provider Note   CSN: [id number] Arrival date & time: 04/02/20  1355     History Chief Complaint  Patient presents with  . Abdominal Pain    Abigail Erickson is a 24 y.o. female.  24 year old female presents with complaint of pain on her left lower ribs extending to her left hip. Pain started today, but worse with walking entheses pain in the hip) or taking a deep breath. Denies chest pain, shortness of breath, fevers, chills, nausea, vomiting, changes in bowel or bladder habits, falls or injuries. No history of similar pain previous. Has not taken anything for her pain. No other complaints or concerns.        Past Medical History:  Diagnosis Date  . Anxiety   . Depression     There are no problems to display for this patient.   Past Surgical History:  Procedure Laterality Date  . TONSILLECTOMY    . WISDOM TOOTH EXTRACTION       OB History   No obstetric history on file.     No family history on file.  Social History   Tobacco Use  . Smoking status: Former Smoker    Types: Cigarettes  . Smokeless tobacco: Never Used  Vaping Use  . Vaping Use: Every day  Substance Use Topics  . Alcohol use: Yes    Comment: occ  . Drug use: No    Home Medications Prior to Admission medications   Medication Sig Start Date End Date Taking? Authorizing Provider  HYDROcodone-acetaminophen (NORCO) 5-325 MG tablet Take 1 tablet by mouth every 4 (four) hours as needed for moderate pain. 08/02/18   Dione Booze, MD  naproxen (NAPROSYN) 500 MG tablet Take 1 tablet (500 mg total) by mouth 2 (two) times daily. 08/02/18   Dione Booze, MD  orphenadrine (NORFLEX) 100 MG tablet Take 1 tablet (100 mg total) by mouth 2 (two) times daily. 08/02/18   Dione Booze, MD  predniSONE (DELTASONE) 20 MG tablet Take 2 tablets (40 mg total) by mouth daily. Take 40 mg by mouth daily for 3 days, then 20mg  by mouth daily for 3 days, then 10mg  daily for 3 days 05/27/19    , PA-C    Allergies    Latex  Review of Systems   Review of Systems  Constitutional: Negative for chills, diaphoresis and fever.  Respiratory: Negative for shortness of breath.   Cardiovascular: Negative for chest pain.  Gastrointestinal: Negative for abdominal pain, constipation, diarrhea, nausea and vomiting.  Genitourinary: Negative for dysuria, frequency and urgency.  Musculoskeletal: Positive for back pain.  Skin: Negative for rash and wound.  Allergic/Immunologic: Negative for immunocompromised state.  Neurological: Negative for weakness.  Hematological: Negative for adenopathy.  Psychiatric/Behavioral: Negative for confusion.  All other systems reviewed and are negative.   Physical Exam Updated Vital Signs BP 107/69 (BP Location: Left Arm)   Pulse 68   Temp 98.5 F (36.9 C) (Oral)   Resp 18   Ht 5\' 2"  (1.575 m)   Wt 68 kg   LMP 03/12/2020   SpO2 100%   BMI 27.44 kg/m   Physical Exam Vitals and nursing note reviewed.  Constitutional:      General: She is not in acute distress.    Appearance: She is well-developed. She is not diaphoretic.  HENT:     Head: Normocephalic and atraumatic.  Cardiovascular:     Rate and Rhythm: Normal rate and regular rhythm.     Heart sounds:  Normal heart sounds.  Pulmonary:     Effort: Pulmonary effort is normal.     Breath sounds: Normal breath sounds.  Chest:       Comments: Tenderness to left lower midclavicular ribs without ecchymosis or crepitus. Abdominal:     General: Bowel sounds are normal.     Palpations: Abdomen is soft.     Tenderness: There is no abdominal tenderness. There is no right CVA tenderness or left CVA tenderness.  Musculoskeletal:       Back:     Comments: Tenderness along left side of back, no midline or bony tenderness.  Skin:    General: Skin is warm and dry.     Findings: No erythema or rash.  Neurological:     Mental Status: She is alert and oriented to person, place, and  time.  Psychiatric:        Behavior: Behavior normal.     ED Results / Procedures / Treatments   Labs (all labs ordered are listed, but only abnormal results are displayed) Labs Reviewed  LIPASE, BLOOD  COMPREHENSIVE METABOLIC PANEL  CBC  URINALYSIS, ROUTINE W REFLEX MICROSCOPIC  PREGNANCY, URINE    EKG None  Radiology No results found.  Procedures Procedures (including critical care time)  Medications Ordered in ED Medications  sodium chloride flush (NS) 0.9 % injection 3 mL (has no administration in time range)  ibuprofen (ADVIL) tablet 800 mg (has no administration in time range)    ED Course  I have reviewed the triage vital signs and the nursing notes.  Pertinent labs & imaging results that were available during my care of the patient were reviewed by me and considered in my medical decision making (see chart for details).  Clinical Course as of Apr 02 1724  Mon Apr 02, 2020  5122 24 year old female with complaint of left side pain today. On exam the patient is well-appearing. Pain is reproduced with palpationLeft posterior back without midline or bony tenderness also tenderness to the left mid clavicular lower ribs. Abdomen is soft and nontender. Labs are reassuring including CBC, CMP, lipase, urinalysis and negative pregnancy test. Suspect musculoskeletal cause, will give Motrin while in the ER, recommend Motrin Tylenol after discharge. Follow-up with PCP if pain persist, return to ER for new or worsening symptoms.   [LM]    Clinical Course User Index [LM] Alden Hipp   MDM Rules/Calculators/A&P                          Final Clinical Impression(s) / ED Diagnoses Final diagnoses:  Muscle strain    Rx / DC Orders ED Discharge Orders    None       Jeannie Fend, PA-C 04/02/20 1725    Vanetta Mulders, MD 04/09/20 [phone]

## 2020-04-02 NOTE — ED Triage Notes (Signed)
Pt c/o pain to "left side" x 2 hours-denies injury-denies n/v/d-NAD-to triage in w/c-states she ambulated in ED WR

## 2020-04-02 NOTE — Discharge Instructions (Signed)
Take Motrin and Tylenol as he has regular pain. Follow-up with your PCP for recheck.

## 2020-04-02 NOTE — ED Notes (Signed)
ED Provider at bedside. 

## 2020-04-02 NOTE — ED Notes (Signed)
Awoke this am to go to work arrived at work began having pain at left side, sharp pain at first then began to feel dull, radiates from ribs to hip area, has some pain when walking. Denies N/V or D, no fevers. No pain with urination, poor appetite today, States taking deep breath makes pain worse

## 2020-12-24 ENCOUNTER — Ambulatory Visit (HOSPITAL_COMMUNITY)
Admission: EM | Admit: 2020-12-24 | Discharge: 2020-12-24 | Disposition: A | Discharge status: Home / Self Care | Payer: BC Managed Care – PPO | Attending: Emergency Medicine | Admitting: Emergency Medicine

## 2020-12-24 ENCOUNTER — Other Ambulatory Visit: Payer: Self-pay

## 2020-12-24 ENCOUNTER — Encounter (HOSPITAL_COMMUNITY): Payer: Self-pay

## 2020-12-24 DIAGNOSIS — M542 Cervicalgia: Secondary | ICD-10-CM | POA: Diagnosis not present

## 2020-12-24 DIAGNOSIS — R194 Change in bowel habit: Secondary | ICD-10-CM

## 2020-12-24 DIAGNOSIS — R110 Nausea: Secondary | ICD-10-CM | POA: Diagnosis not present

## 2020-12-24 MED ORDER — KETOROLAC TROMETHAMINE 30 MG/ML IJ SOLN
INTRAMUSCULAR | Status: AC
  Filled 2020-12-24: qty 1

## 2020-12-24 MED ORDER — NAPROXEN 500 MG PO TABS: 500.0000 mg | ORAL_TABLET | Freq: Two times a day (BID) | ORAL | 0 refills | Status: AC

## 2020-12-24 MED ORDER — KETOROLAC TROMETHAMINE 30 MG/ML IJ SOLN
30.0000 mg | Freq: Once | INTRAMUSCULAR | Status: AC
  Administered 2020-12-24: 30 mg via INTRAMUSCULAR

## 2020-12-24 MED ORDER — ONDANSETRON HCL 4 MG PO TABS: 4.0000 mg | ORAL_TABLET | Freq: Four times a day (QID) | ORAL | 0 refills | Status: AC

## 2020-12-24 MED ORDER — CYCLOBENZAPRINE HCL 5 MG PO TABS: 5.0000 mg | ORAL_TABLET | Freq: Every day | ORAL | 0 refills | Status: AC

## 2020-12-24 MED ORDER — DICYCLOMINE HCL 20 MG PO TABS: 20.0000 mg | ORAL_TABLET | Freq: Two times a day (BID) | ORAL | 0 refills | Status: AC

## 2020-12-24 NOTE — Discharge Instructions (Addendum)
Follow up with primary doctor in 2-3 weeks after medications complete for reevaluation   Take bentyl twice a day to help with bowel movements  Can use zofran every 6 hours as needed for nausea  Use naproxen twice a day for the next five days then as needed  For increased abdominal pain, fever, chills, coughing up more blood go to emergency department   Can use Flexeril at bedtime as needed

## 2020-12-24 NOTE — ED Triage Notes (Addendum)
Pt in with c/o right lower abdomen/back tenderness. Pt also c/o nausea that has been going on for a few weeks. Pt states she coughed up blood as well  Pt has been taking tums and omeprazole with no relief  Pt also c/o intermittent right arm numbness and right leg tingling  States her right shoulder shoulder has a sharp pain that radiates down her arm

## 2020-12-24 NOTE — ED Provider Notes (Signed)
[facility]    CSN: [id number] Arrival date & time: 12/24/20  1209      History   Chief Complaint Chief Complaint  Patient presents with  . Nausea  . Shoulder Pain  . Abdominal Pain    HPI Abigail Erickson is a 25 y.o. female.   Patient presents with nausea and frequent bowel movements for approximately one month. Described as 2-3 soft bowel movements daily. Was constipated for about 1 month prior, used a laxative once then bowel pattern changed. Denies vomiting but has episodes of gagging, coughed up blood once today after gagging. Denies changes in diet, increase in belching and gas, diarrhea. Has right upper abdominal and back pain that began 1 week ago. Seen by PCP on 4/14, treated for GERD. Using tums and omeprazole with no relief.    C/o of right sided neck pain and chest pain radiating into her shoulder, down arm causing tingling beginning 1 day ago. Denies precipitating event, pushing, pulling or lifting. ROM intact.  Works for home, mainly sitting.   Past Medical History:  Diagnosis Date  . Anxiety   . Depression     There are no problems to display for this patient.   Past Surgical History:  Procedure Laterality Date  . TONSILLECTOMY    . WISDOM TOOTH EXTRACTION      OB History   No obstetric history on file.      Home Medications    Prior to Admission medications   Medication Sig Start Date End Date Taking? Authorizing Provider  cyclobenzaprine (FLEXERIL) 5 MG tablet Take 1 tablet (5 mg total) by mouth at bedtime. 12/24/20  Yes Zaidan Keeble R, NP  dicyclomine (BENTYL) 20 MG tablet Take 1 tablet (20 mg total) by mouth 2 (two) times daily. 12/24/20  Yes Meryl Ponder R, NP  naproxen (NAPROSYN) 500 MG tablet Take 1 tablet (500 mg total) by mouth 2 (two) times daily. 12/24/20  Yes Yer Castello R, NP  ondansetron (ZOFRAN) 4 MG tablet Take 1 tablet (4 mg total) by mouth every 6 (six) hours. 12/24/20  Yes Katelee Schupp, Elita Boone, NP   HYDROcodone-acetaminophen (NORCO) 5-325 MG tablet Take 1 tablet by mouth every 4 (four) hours as needed for moderate pain. 08/02/18   Dione Booze, MD  predniSONE (DELTASONE) 20 MG tablet Take 2 tablets (40 mg total) by mouth daily. Take 40 mg by mouth daily for 3 days, then 20mg  by mouth daily for 3 days, then 10mg  daily for 3 days 05/27/19   , PA-C    Family History History reviewed. No pertinent family history.  Social History Social History   Tobacco Use  . Smoking status: Former Smoker    Types: Cigarettes  . Smokeless tobacco: Never Used  Vaping Use  . Vaping Use: Every day  Substance Use Topics  . Alcohol use: Yes    Comment: occ  . Drug use: No     Allergies   Latex   Review of Systems Review of Systems  Defer to HPI   Physical Exam Triage Vital Signs ED Triage Vitals  Enc Vitals Group     BP 12/24/20 1311 114/70     Pulse Rate 12/24/20 1311 61     Resp 12/24/20 1311 18     Temp 12/24/20 1311 99.1 F (37.3 C)     Temp src --      SpO2 12/24/20 1311 100 %     Weight --      Height --  Head Circumference --      Peak Flow --      Pain Score 12/24/20 1309 6     Pain Loc --      Pain Edu? --      Excl. in GC? --    No data found.  Updated Vital Signs BP 114/70   Pulse 61   Temp 99.1 F (37.3 C)   Resp 18   LMP 12/03/2020 (Approximate)   SpO2 100%   Visual Acuity Right Eye Distance:   Left Eye Distance:   Bilateral Distance:    Right Eye Near:   Left Eye Near:    Bilateral Near:     Physical Exam Constitutional:      Appearance: She is well-developed and normal weight.  HENT:     Head: Normocephalic.  Eyes:     Extraocular Movements: Extraocular movements intact.  Neck:     Trachea: Trachea normal.   Pulmonary:     Effort: Pulmonary effort is normal.  Abdominal:     General: Abdomen is flat. Bowel sounds are normal.     Palpations: Abdomen is soft.     Tenderness: There is abdominal tenderness in the right  upper quadrant. There is no right CVA tenderness, left CVA tenderness or guarding.     Hernia: No hernia is present.  Musculoskeletal:     Right shoulder: Normal.     Left shoulder: Normal.     Cervical back: Normal range of motion. No edema or erythema. Muscular tenderness present. No pain with movement or spinous process tenderness.     Thoracic back: Tenderness present.     Lumbar back: Normal.       Back:  Skin:    General: Skin is warm and dry.  Neurological:     General: No focal deficit present.     Mental Status: She is alert and oriented to person, place, and time.     Cranial Nerves: Cranial nerves are intact.     Sensory: Sensation is intact.     Motor: Motor function is intact.     Coordination: Coordination is intact.     Gait: Gait is intact.      UC Treatments / Results  Labs (all labs ordered are listed, but only abnormal results are displayed) Labs Reviewed - No data to display  EKG   Radiology No results found.  Procedures Procedures (including critical care time)  Medications Ordered in UC Medications  ketorolac (TORADOL) 30 MG/ML injection 30 mg (30 mg Intramuscular Given 12/24/20 1408)    Initial Impression / Assessment and Plan / UC Course  I have reviewed the triage vital signs and the nursing notes.  Pertinent labs & imaging results that were available during my care of the patient were reviewed by me and considered in my medical decision making (see chart for details).  Acute neck pain Nausea Frequent bowel movements   1. Naproxen 500 mg  2. Flexeril 10 mg at bedtime 3. Toradol 30 mg now  4 Bentyl 20 mg bid for 14 days 5. zofran 4 mg every 6 hours as needed 6. PCP follow up in 2-3 weeks Final Clinical Impressions(s) / UC Diagnoses   Final diagnoses:  Acute neck pain     Discharge Instructions     Follow up with primary doctor in 2-3 weeks after medications complete for reevaluation   Take bentyl twice a day to help with  bowel movements  Can use zofran every 6 hours as  needed for nausea  Use naproxen twice a day for the next five days then as needed  For increased abdominal pain, fever, chills, coughing up more blood go to emergency department   Can use Flexeril at bedtime as needed    ED Prescriptions    Medication Sig Dispense Auth. Provider   ondansetron (ZOFRAN) 4 MG tablet Take 1 tablet (4 mg total) by mouth every 6 (six) hours. 12 tablet Shakemia Madera R, NP   dicyclomine (BENTYL) 20 MG tablet Take 1 tablet (20 mg total) by mouth 2 (two) times daily. 20 tablet Marlan Steward R, NP   naproxen (NAPROSYN) 500 MG tablet Take 1 tablet (500 mg total) by mouth 2 (two) times daily. 30 tablet Vinal Rosengrant R, NP   cyclobenzaprine (FLEXERIL) 5 MG tablet Take 1 tablet (5 mg total) by mouth at bedtime. 30 tablet Valinda Hoar, NP     PDMP not reviewed this encounter.   Valinda Hoar, NP 12/24/20 1514

## 2021-06-24 DIAGNOSIS — Z01419 Encounter for gynecological examination (general) (routine) without abnormal findings: Secondary | ICD-10-CM | POA: Diagnosis not present

## 2021-06-24 DIAGNOSIS — Z3202 Encounter for pregnancy test, result negative: Secondary | ICD-10-CM | POA: Diagnosis not present

## 2021-06-24 DIAGNOSIS — Z113 Encounter for screening for infections with a predominantly sexual mode of transmission: Secondary | ICD-10-CM | POA: Diagnosis not present

## 2021-08-01 DIAGNOSIS — Z3002 Counseling and instruction in natural family planning to avoid pregnancy: Secondary | ICD-10-CM | POA: Diagnosis not present

## 2021-08-01 DIAGNOSIS — Z30430 Encounter for insertion of intrauterine contraceptive device: Secondary | ICD-10-CM | POA: Diagnosis not present

## 2021-08-30 DIAGNOSIS — Z30431 Encounter for routine checking of intrauterine contraceptive device: Secondary | ICD-10-CM | POA: Diagnosis not present

## 2021-08-30 DIAGNOSIS — N938 Other specified abnormal uterine and vaginal bleeding: Secondary | ICD-10-CM | POA: Diagnosis not present

## 2021-10-05 ENCOUNTER — Other Ambulatory Visit: Payer: Self-pay

## 2021-10-05 ENCOUNTER — Encounter (HOSPITAL_COMMUNITY): Payer: Self-pay | Admitting: *Deleted

## 2021-10-05 ENCOUNTER — Emergency Department (HOSPITAL_COMMUNITY): Payer: BC Managed Care – PPO

## 2021-10-05 ENCOUNTER — Ambulatory Visit (HOSPITAL_COMMUNITY)
Admission: EM | Admit: 2021-10-05 | Discharge: 2021-10-05 | Payer: BC Managed Care – PPO | Attending: Urgent Care | Admitting: Urgent Care

## 2021-10-05 ENCOUNTER — Emergency Department (HOSPITAL_COMMUNITY)
Admission: EM | Admit: 2021-10-05 | Discharge: 2021-10-05 | Disposition: A | Discharge status: Home / Self Care | Payer: BC Managed Care – PPO | Attending: Emergency Medicine | Admitting: Emergency Medicine

## 2021-10-05 ENCOUNTER — Encounter (HOSPITAL_COMMUNITY): Payer: Self-pay | Admitting: Emergency Medicine

## 2021-10-05 DIAGNOSIS — Z91040 Latex allergy status: Secondary | ICD-10-CM | POA: Insufficient documentation

## 2021-10-05 DIAGNOSIS — N83202 Unspecified ovarian cyst, left side: Secondary | ICD-10-CM | POA: Diagnosis not present

## 2021-10-05 DIAGNOSIS — K5900 Constipation, unspecified: Secondary | ICD-10-CM | POA: Insufficient documentation

## 2021-10-05 DIAGNOSIS — R1031 Right lower quadrant pain: Secondary | ICD-10-CM

## 2021-10-05 DIAGNOSIS — N83201 Unspecified ovarian cyst, right side: Secondary | ICD-10-CM | POA: Diagnosis not present

## 2021-10-05 DIAGNOSIS — Z7952 Long term (current) use of systemic steroids: Secondary | ICD-10-CM | POA: Diagnosis not present

## 2021-10-05 LAB — COMPREHENSIVE METABOLIC PANEL
ALT: 12 U/L (ref 0–44)
AST: 17 U/L (ref 15–41)
Albumin: 3.6 g/dL (ref 3.5–5.0)
Alkaline Phosphatase: 42 U/L (ref 38–126)
Anion gap: 5 (ref 5–15)
BUN: 10 mg/dL (ref 6–20)
CO2: 20 mmol/L — ABNORMAL LOW (ref 22–32)
Calcium: 8.6 mg/dL — ABNORMAL LOW (ref 8.9–10.3)
Chloride: 109 mmol/L (ref 98–111)
Creatinine, Ser: 0.8 mg/dL (ref 0.44–1.00)
GFR, Estimated: >60 mL/min (ref >60)
Glucose, Bld: 88 mg/dL (ref 70–99)
Potassium: 3.8 mmol/L (ref 3.5–5.1)
Sodium: 134 mmol/L — ABNORMAL LOW (ref 135–145)
Total Bilirubin: 0.7 mg/dL (ref 0.3–1.2)
Total Protein: 6.4 g/dL — ABNORMAL LOW (ref 6.5–8.1)

## 2021-10-05 LAB — POCT URINALYSIS DIPSTICK, ED / UC
Bilirubin Urine: NEGATIVE
Glucose, UA: NEGATIVE mg/dL
Hgb urine dipstick: NEGATIVE
Ketones, ur: NEGATIVE mg/dL
Leukocytes,Ua: NEGATIVE
Nitrite: NEGATIVE
Protein, ur: NEGATIVE mg/dL
Specific Gravity, Urine: >=1.03 (ref 1.005–1.030)
Urobilinogen, UA: 0.2 mg/dL (ref 0.0–1.0)
pH: 5.5 (ref 5.0–8.0)

## 2021-10-05 LAB — CBC WITH DIFFERENTIAL/PLATELET
Abs Immature Granulocytes: 0.03 10*3/uL (ref 0.00–0.07)
Basophils Absolute: 0.1 10*3/uL (ref 0.0–0.1)
Basophils Relative: 1 %
Eosinophils Absolute: 0.4 10*3/uL (ref 0.0–0.5)
Eosinophils Relative: 4 %
HCT: 41.7 % (ref 36.0–46.0)
Hemoglobin: 14.1 g/dL (ref 12.0–15.0)
Immature Granulocytes: 0 %
Lymphocytes Relative: 34 %
Lymphs Abs: 3.2 10*3/uL (ref 0.7–4.0)
MCH: 32 pg (ref 26.0–34.0)
MCHC: 33.8 g/dL (ref 30.0–36.0)
MCV: 94.8 fL (ref 80.0–100.0)
Monocytes Absolute: 0.6 10*3/uL (ref 0.1–1.0)
Monocytes Relative: 7 %
Neutro Abs: 5.1 10*3/uL (ref 1.7–7.7)
Neutrophils Relative %: 54 %
Platelets: 214 10*3/uL (ref 150–400)
RBC: 4.4 MIL/uL (ref 3.87–5.11)
RDW: 12 % (ref 11.5–15.5)
WBC: 9.3 10*3/uL (ref 4.0–10.5)
nRBC: 0 % (ref 0.0–0.2)

## 2021-10-05 LAB — I-STAT BETA HCG BLOOD, ED (MC, WL, AP ONLY): I-stat hCG, quantitative: <5 m[IU]/mL (ref <5)

## 2021-10-05 LAB — LIPASE, BLOOD: Lipase: 32 U/L (ref 11–51)

## 2021-10-05 LAB — POC URINE PREG, ED: Preg Test, Ur: NEGATIVE

## 2021-10-05 MED ORDER — ONDANSETRON HCL 4 MG/2ML IJ SOLN
4.0000 mg | Freq: Once | INTRAMUSCULAR | Status: AC
Start: 2021-10-05 — End: 2021-10-05
  Administered 2021-10-05: 4 mg via INTRAVENOUS
  Filled 2021-10-05: qty 2

## 2021-10-05 MED ORDER — IOHEXOL 300 MG/ML  SOLN
100.0000 mL | Freq: Once | INTRAMUSCULAR | Status: AC | PRN
  Administered 2021-10-05: 100 mL via INTRAVENOUS

## 2021-10-05 MED ORDER — MORPHINE SULFATE (PF) 4 MG/ML IV SOLN
4.0000 mg | Freq: Once | INTRAVENOUS | Status: AC
  Administered 2021-10-05: 4 mg via INTRAVENOUS
  Filled 2021-10-05: qty 1

## 2021-10-05 MED ORDER — DICLOFENAC SODIUM 75 MG PO TBEC
75.0000 mg | DELAYED_RELEASE_TABLET | Freq: Two times a day (BID) | ORAL | 0 refills | Status: AC
Start: 2021-10-05 — End: ?

## 2021-10-05 MED ORDER — KETOROLAC TROMETHAMINE 30 MG/ML IJ SOLN
30.0000 mg | Freq: Once | INTRAMUSCULAR | Status: AC
Start: 2021-10-05 — End: 2021-10-05
  Administered 2021-10-05: 30 mg via INTRAVENOUS
  Filled 2021-10-05: qty 1

## 2021-10-05 NOTE — ED Notes (Signed)
Pt transported to US

## 2021-10-05 NOTE — ED Notes (Addendum)
Patient transported to Ultrasound 

## 2021-10-05 NOTE — ED Notes (Signed)
Pt back from US

## 2021-10-05 NOTE — ED Provider Notes (Signed)
[facility]    CSN: [id number] Arrival date & time: 10/05/21  1111      History   Chief Complaint Chief Complaint  Patient presents with   Abdominal Pain   Flank Pain    HPI Abigail Erickson is a 26 y.o. female.   Pleasant 26 year old female presents today with a 2-day onset of right lower quadrant pain.  She states it started sporadically yesterday without any known cause.  She reports in her right lower quadrant with some radiation around her hip to her back.  She states " the pain is weird, it is sharp, dull, crampy, and causes me to have burning pain down my legs at times."  She states yesterday she was in 10 out of 10 pain, today she is in 8 out of 10 pain.  She reports walking causes the pain to increase.  Laying down makes her feel better.  She admits to feeling nauseated but has not had any vomiting.  She denies any fever.  She has no rash in that area.   Abdominal Pain Associated symptoms: nausea   Flank Pain Associated symptoms include abdominal pain.   Past Medical History:  Diagnosis Date   Anxiety    Depression     There are no problems to display for this patient.   Past Surgical History:  Procedure Laterality Date   TONSILLECTOMY     WISDOM TOOTH EXTRACTION      OB History   No obstetric history on file.      Home Medications    Prior to Admission medications   Medication Sig Start Date End Date Taking? Authorizing Provider  cyclobenzaprine (FLEXERIL) 5 MG tablet Take 1 tablet (5 mg total) by mouth at bedtime. 12/24/20   White, Elita Boone, NP  dicyclomine (BENTYL) 20 MG tablet Take 1 tablet (20 mg total) by mouth 2 (two) times daily. 12/24/20   Valinda Hoar, NP  HYDROcodone-acetaminophen (NORCO) 5-325 MG tablet Take 1 tablet by mouth every 4 (four) hours as needed for moderate pain. 08/02/18   Dione Booze, MD  naproxen (NAPROSYN) 500 MG tablet Take 1 tablet (500 mg total) by mouth 2 (two) times daily. 12/24/20   White, Elita Boone, NP   ondansetron (ZOFRAN) 4 MG tablet Take 1 tablet (4 mg total) by mouth every 6 (six) hours. 12/24/20   White, Elita Boone, NP  predniSONE (DELTASONE) 20 MG tablet Take 2 tablets (40 mg total) by mouth daily. Take 40 mg by mouth daily for 3 days, then 20mg  by mouth daily for 3 days, then 10mg  daily for 3 days 05/27/19   , PA-C    Family History History reviewed. No pertinent family history.  Social History Social History   Tobacco Use   Smoking status: Former    Types: Cigarettes   Smokeless tobacco: Never  Vaping Use   Vaping Use: Every day  Substance Use Topics   Alcohol use: Yes    Comment: occ   Drug use: No     Allergies   Latex   Review of Systems Review of Systems  Gastrointestinal:  Positive for abdominal pain and nausea.  Genitourinary:  Positive for flank pain.  All other systems reviewed and are negative.   Physical Exam Triage Vital Signs ED Triage Vitals  Enc Vitals Group     BP 10/05/21 1144 112/76     Pulse Rate 10/05/21 1144 64     Resp 10/05/21 1144 18     Temp 10/05/21  1144 98.1 F (36.7 C)     Temp src --      SpO2 10/05/21 1144 98 %     Weight --      Height --      Head Circumference --      Peak Flow --      Pain Score 10/05/21 1138 10     Pain Loc --      Pain Edu? --      Excl. in GC? --    No data found.  Updated Vital Signs BP 112/76    Pulse 64    Temp 98.1 F (36.7 C)    Resp 18    LMP 09/02/2021 (Approximate)    SpO2 98%   Visual Acuity Right Eye Distance:   Left Eye Distance:   Bilateral Distance:    Right Eye Near:   Left Eye Near:    Bilateral Near:     Physical Exam Vitals and nursing note reviewed. Exam conducted with a chaperone present.  Constitutional:      General: She is not in acute distress.    Appearance: She is well-developed. She is obese. She is not ill-appearing, toxic-appearing or diaphoretic.  HENT:     Head: Normocephalic and atraumatic.     Mouth/Throat:     Mouth: Mucous  membranes are moist.     Pharynx: No pharyngeal swelling or oropharyngeal exudate.  Eyes:     General: No scleral icterus.    Extraocular Movements: Extraocular movements intact.     Pupils: Pupils are equal, round, and reactive to light.  Cardiovascular:     Rate and Rhythm: Normal rate.     Heart sounds: Normal heart sounds. No murmur heard. Pulmonary:     Effort: Pulmonary effort is normal. No respiratory distress.  Abdominal:     General: Abdomen is flat. Bowel sounds are normal. There is no distension. There are no signs of injury.     Palpations: Abdomen is soft. There is no shifting dullness, fluid wave, hepatomegaly, splenomegaly, mass or pulsatile mass.     Tenderness: There is abdominal tenderness in the right lower quadrant. There is rebound. There is no right CVA tenderness, left CVA tenderness or guarding. Positive signs include Rovsing's sign, McBurney's sign, psoas sign and obturator sign. Negative signs include Murphy's sign.     Hernia: No hernia is present.  Skin:    General: Skin is warm.     Capillary Refill: Capillary refill takes less than 2 seconds.     Coloration: Skin is not pale.     Findings: No rash.  Neurological:     General: No focal deficit present.     Mental Status: She is alert.  Psychiatric:        Mood and Affect: Mood normal.     UC Treatments / Results  Labs (all labs ordered are listed, but only abnormal results are displayed) Labs Reviewed  POCT URINALYSIS DIPSTICK, ED / UC  POC URINE PREG, ED    EKG   Radiology No results found.  Procedures Procedures (including critical care time)  Medications Ordered in UC Medications - No data to display  Initial Impression / Assessment and Plan / UC Course  I have reviewed the triage vital signs and the nursing notes.  Pertinent labs & imaging results that were available during my care of the patient were reviewed by me and considered in my medical decision making (see chart for  details).  Acute right lower quadrant pain -UA and pregnancy test negative.  Clinical concern for developing appendicitis.  Recommend ER work-up for CT scan and labs.   Final Clinical Impressions(s) / UC Diagnoses   Final diagnoses:  Acute right lower quadrant pain     Discharge Instructions      Your symptoms are concerning for developing acute appendicitis. Your urine dipstick was negative for infection and your urine pregnancy test was negative.  It would be best managed through the emergency room to obtain labs and a CT scan. Please go straight to the emergency room for further work-up     ED Prescriptions   None    PDMP not reviewed this encounter.   Maretta BeesCrain, Anacaren Kohan L, [place] 10/05/21 1209

## 2021-10-05 NOTE — ED Triage Notes (Signed)
Patient arrives ambulatory c/o lower right sided abdominal pain and right lower back pain onset of yesterday. Reports small amount of blood when wiping.

## 2021-10-05 NOTE — ED Notes (Signed)
Patient is being discharged from the Urgent Care and sent to the Emergency Department via personal vehicle  . Per provider whitney crain, patient is in need of higher level of care due to possible appendicitis. Patient is aware and verbalizes understanding of plan of care.  Vitals:   10/05/21 1144  BP: 112/76  Pulse: 64  Resp: 18  Temp: 98.1 F (36.7 C)  SpO2: 98%

## 2021-10-05 NOTE — Discharge Instructions (Signed)
See your Gynecologist for recheck.  Return if any problems.

## 2021-10-05 NOTE — ED Provider Notes (Signed)
[facility] Provider Note   CSN: [id number] Arrival date & time: 10/05/21  1219     History  Chief Complaint  Patient presents with   Abdominal Pain    Abigail Erickson is a 26 y.o. female.  Patient with no pertinent past medical history presents today with complaints of RLQ abdominal pain. She states that same began suddenly yesterday, was sharp in nature and located in her right lower quadrant.  She states that the pain is worse with activity specifically walking.  The pain radiates around to her right flank as well.  She states that the pain was initially cramping in nature and therefore she thought it was associated with your menstrual cycle as she had some spotting yesterday as well.  However it is progressed into a more sharp and severe pain.  She endorses associated nausea without vomiting or diarrhea.  Does state that she had hard stools yesterday which is unusual for her.  Endorses that her menstrual cycle is irregular due to her IUD, however her last menstrual cycle was the beginning of January.   The history is provided by the patient. No language interpreter was used.  Abdominal Pain Associated symptoms: constipation, nausea and vaginal bleeding   Associated symptoms: no chills, no diarrhea, no dysuria, no fever, no vaginal discharge and no vomiting       Home Medications Prior to Admission medications   Medication Sig Start Date End Date Taking? Authorizing Provider  cyclobenzaprine (FLEXERIL) 5 MG tablet Take 1 tablet (5 mg total) by mouth at bedtime. 12/24/20   White, Leitha Schuller, NP  dicyclomine (BENTYL) 20 MG tablet Take 1 tablet (20 mg total) by mouth 2 (two) times daily. 12/24/20   Hans Eden, NP  HYDROcodone-acetaminophen (NORCO) 5-325 MG tablet Take 1 tablet by mouth every 4 (four) hours as needed for moderate pain. [id number]   Delora Fuel, MD  naproxen (NAPROSYN) 500 MG tablet Take 1 tablet (500 mg total) by mouth 2 (two)  times daily. 12/24/20   White, Leitha Schuller, NP  ondansetron (ZOFRAN) 4 MG tablet Take 1 tablet (4 mg total) by mouth every 6 (six) hours. 12/24/20   White, Leitha Schuller, NP  predniSONE (DELTASONE) 20 MG tablet Take 2 tablets (40 mg total) by mouth daily. Take 40 mg by mouth daily for 3 days, then 20mg  by mouth daily for 3 days, then 10mg  daily for 3 days 05/27/19   Montine Circle, PA-C      Allergies    Latex    Review of Systems   Review of Systems  Constitutional:  Negative for chills and fever.  Gastrointestinal:  Positive for abdominal pain, constipation and nausea. Negative for abdominal distention, anal bleeding, blood in stool, diarrhea, rectal pain and vomiting.  Genitourinary:  Positive for vaginal bleeding. Negative for dysuria, vaginal discharge and vaginal pain.  All other systems reviewed and are negative.  Physical Exam Updated Vital Signs BP 117/73 (BP Location: Left Arm)    Pulse 63    Temp 98.1 F (36.7 C) (Oral)    Resp (!) 22    Ht 5\' 2"  (1.575 m)    Wt 90.7 kg    SpO2 100%    BMI 36.58 kg/m  Physical Exam Vitals and nursing note reviewed.  Constitutional:      General: She is not in acute distress.    Appearance: She is well-developed. She is obese. She is not ill-appearing, toxic-appearing or diaphoretic.     Comments:  Patient resting comfortably in bed in no acute distress  HENT:     Head: Normocephalic and atraumatic.  Eyes:     Extraocular Movements: Extraocular movements intact.  Cardiovascular:     Rate and Rhythm: Normal rate and regular rhythm.     Heart sounds: Normal heart sounds.  Pulmonary:     Effort: Pulmonary effort is normal. No respiratory distress.  Abdominal:     General: Abdomen is flat.     Palpations: Abdomen is soft.     Tenderness: There is abdominal tenderness in the right lower quadrant and suprapubic area. There is right CVA tenderness. There is no left CVA tenderness. Positive signs include Rovsing's sign and McBurney's sign. Negative  signs include Murphy's sign.  Skin:    General: Skin is warm and dry.  Neurological:     General: No focal deficit present.     Mental Status: She is alert.  Psychiatric:        Mood and Affect: Mood normal.        Behavior: Behavior normal.    ED Results / Procedures / Treatments   Labs (all labs ordered are listed, but only abnormal results are displayed) Labs Reviewed  COMPREHENSIVE METABOLIC PANEL - Abnormal; Notable for the following components:      Result Value   Sodium 134 (*)    CO2 20 (*)    Calcium 8.6 (*)    Total Protein 6.4 (*)    All other components within normal limits  CBC WITH DIFFERENTIAL/PLATELET  LIPASE, BLOOD  I-STAT BETA HCG BLOOD, ED ([facility], [facility], [facility] ONLY)    EKG None  Radiology CT ABDOMEN PELVIS W CONTRAST  Result Date: 10/05/2021 CLINICAL DATA:  Right lower quadrant pain EXAM: CT ABDOMEN AND PELVIS WITH CONTRAST TECHNIQUE: Multidetector CT imaging of the abdomen and pelvis was performed using the standard protocol following bolus administration of intravenous contrast. RADIATION DOSE REDUCTION: This exam was performed according to the departmental dose-optimization program which includes automated exposure control, adjustment of the mA and/or kV according to patient size and/or use of iterative reconstruction technique. CONTRAST:  OMNIPAQUE IOHEXOL 300 MG/ML  SOLN COMPARISON:  None. FINDINGS: Lower chest: No acute abnormality. Hepatobiliary: No solid liver abnormality is seen. No gallstones, gallbladder wall thickening, or biliary dilatation. Pancreas: Unremarkable. No pancreatic ductal dilatation or surrounding inflammatory changes. Spleen: Normal in size without significant abnormality. Adrenals/Urinary Tract: Adrenal glands are unremarkable. Kidneys are normal, without renal calculi, solid lesion, or hydronephrosis. Bladder is unremarkable. Stomach/Bowel: Stomach is within normal limits. Appendix appears normal. No evidence of bowel wall thickening,  distention, or inflammatory changes. Vascular/Lymphatic: No significant vascular findings are present. No enlarged abdominal or pelvic lymph nodes. Reproductive: IUD present in the fundal endometrial cavity. Left ovarian cyst measuring 6.5 cm (series 3, image 60). Right ovarian cyst measuring 3.6 cm (series 3, image 66). Other: No abdominal wall hernia or abnormality. No ascites. Musculoskeletal: No acute or significant osseous findings. IMPRESSION: 1. No acute CT findings of the abdomen or pelvis to explain right lower quadrant pain. Normal appendix. 2. Bilateral ovarian cysts, measuring up to 6.5 cm on the left and 3.6 cm on the right. Although benign, these may be symptomatic due to size. Note that any enlarged ovary may serve as a nidus of torsion in the setting of acutely referable pain. Follow-up by [place] is recommended in 3-6 months. Note: This recommendation does not apply to premenarchal patients and to those with increased risk (genetic, family history, elevated tumor  markers or other high-risk factors) of ovarian cancer. Reference: JACR 2020 Feb; 17(2):248-254 3. IUD present in the fundal endometrial cavity. Electronically Signed   By: Delanna Ahmadi M.D.   On: 10/05/2021 14:39    Procedures Procedures    Medications Ordered in ED Medications  morphine (PF) 4 MG/ML injection 4 mg (has no administration in time range)  ondansetron (ZOFRAN) injection 4 mg (4 mg Intravenous Given 10/05/21 1256)  iohexol (OMNIPAQUE) 300 MG/ML solution 100 mL (100 mLs Intravenous Contrast Given 10/05/21 1430)    ED Course/ Medical Decision Making/ A&P                           Medical Decision Making Amount and/or Complexity of Data Reviewed Labs: ordered. Radiology: ordered. ECG/medicine tests: ordered.  Risk Prescription drug management.   This patient presents to the ED for concern of RLQ abdominal pain, this involves an extensive number of treatment options, and is a complaint that carries with it a high  risk of complications and morbidity.  The differential diagnosis includes appendicitis, ectopic pregnancy, ovarian torsion, nephrolithiasis. This list is not comprehensive   Co morbidities that complicate the patient evaluation  none   Lab Tests:  I Ordered, and personally interpreted labs.  The pertinent results include:  no leukocytosis or anemia. No significant electrolyte abnormalities. No changes in kidney or liver function. Lipase normal. Urine clean. Upreg negative   Imaging Studies ordered:  I ordered imaging studies including CT abdomen pelvis with contrast, [place] torsion study I independently visualized and interpreted imaging which showed  CT 1. No acute CT findings of the abdomen or pelvis to explain right lower quadrant pain. Normal appendix. 2. Bilateral ovarian cysts, measuring up to 6.5 cm on the left and 3.6 cm on the right. Although benign, these may be symptomatic due to size. Note that any enlarged ovary may serve as a nidus of torsion in the setting of acutely referable pain. Follow-up by [place] is recommended in 3-6 months. 3. IUD present in the fundal endometrial cavity. [place] pending at shift change I agree with the radiologist interpretation   Medicines ordered and prescription drug management:  I ordered medication including zofran and morphine  for nausea and pain  Reevaluation of the patient after these medicines showed that the patient improved I have reviewed the patients home medicines and have made adjustments as needed  Patient with RLQ abdominal pain that started suddenly yesterday and has been unchanged since onset. Endorses nausea without vomiting and some constipation as well.  Also endorses some light spotting that started yesterday.  No history of abdominal surgeries.  She is afebrile, nontoxic-appearing, and in no acute distress.  Labs unremarkable for acute findings.  CT scan revealed normal appendix with several ovarian cysts on both sides.  Given this,  I have decided to ultrasound her to assess for torsion.  This is pending at shift change.  If this is normal, I suspect that patient will be stable for discharge.  Care handoff to Alyse Low, PA-C at shift change.  Please see their note for further evaluation and dispo.   Final Clinical Impression(s) / ED Diagnoses Final diagnoses:  None    Rx / DC Orders ED Discharge Orders     None         Nestor Lewandowsky 10/06/21 [id number]    Wyvonnia Dusky, MD 10/06/21 [id number]

## 2021-10-05 NOTE — ED Triage Notes (Signed)
Pt reports RT flank and ABD pain . Pt reports It hurts to breath ,talk and move.

## 2021-10-05 NOTE — ED Provider Notes (Signed)
Pt's care assumed at 4pm.  Ct shows bilat ovarian cyst.  Ultrasound obtained and shows no evidence of torsion.  Pt counseled on ovarian cyst.  Pt sees [facility] in [place].  Pt advised to schedule to see her Gyn for evaluation.  Repeat ultrasound followup advised.  Pt given toradol Iv here.  Rx for voltaren for discomfort    Abigail Erickson 10/05/21 1739    Jacalyn Lefevre, MD 10/05/21 Windy Fast

## 2021-10-05 NOTE — ED Notes (Signed)
Patient transported to Ultrasound 

## 2021-10-05 NOTE — Discharge Instructions (Addendum)
Your symptoms are concerning for developing acute appendicitis. Your urine dipstick was negative for infection and your urine pregnancy test was negative.  It would be best managed through the emergency room to obtain labs and a CT scan. Please go straight to the emergency room for further work-up

## 2021-10-17 DIAGNOSIS — N83292 Other ovarian cyst, left side: Secondary | ICD-10-CM | POA: Diagnosis not present

## 2021-10-17 DIAGNOSIS — N83202 Unspecified ovarian cyst, left side: Secondary | ICD-10-CM | POA: Diagnosis not present

## 2021-10-17 DIAGNOSIS — R1030 Lower abdominal pain, unspecified: Secondary | ICD-10-CM | POA: Diagnosis not present

## 2021-10-17 DIAGNOSIS — N8302 Follicular cyst of left ovary: Secondary | ICD-10-CM | POA: Diagnosis not present

## 2021-10-17 DIAGNOSIS — N83519 Torsion of ovary and ovarian pedicle, unspecified side: Secondary | ICD-10-CM | POA: Diagnosis not present

## 2021-10-17 DIAGNOSIS — N83201 Unspecified ovarian cyst, right side: Secondary | ICD-10-CM | POA: Diagnosis not present

## 2021-10-18 DIAGNOSIS — N83201 Unspecified ovarian cyst, right side: Secondary | ICD-10-CM | POA: Diagnosis not present

## 2021-10-18 DIAGNOSIS — M79604 Pain in right leg: Secondary | ICD-10-CM | POA: Diagnosis not present

## 2021-10-18 DIAGNOSIS — N83519 Torsion of ovary and ovarian pedicle, unspecified side: Secondary | ICD-10-CM | POA: Diagnosis not present

## 2021-10-18 DIAGNOSIS — R2241 Localized swelling, mass and lump, right lower limb: Secondary | ICD-10-CM | POA: Diagnosis not present

## 2021-10-18 DIAGNOSIS — N83202 Unspecified ovarian cyst, left side: Secondary | ICD-10-CM | POA: Diagnosis not present

## 2021-10-23 DIAGNOSIS — R350 Frequency of micturition: Secondary | ICD-10-CM | POA: Diagnosis not present

## 2021-10-23 DIAGNOSIS — R300 Dysuria: Secondary | ICD-10-CM | POA: Diagnosis not present

## 2021-10-23 DIAGNOSIS — R3915 Urgency of urination: Secondary | ICD-10-CM | POA: Diagnosis not present

## 2021-11-25 DIAGNOSIS — J029 Acute pharyngitis, unspecified: Secondary | ICD-10-CM | POA: Diagnosis not present

## 2021-11-25 DIAGNOSIS — J020 Streptococcal pharyngitis: Secondary | ICD-10-CM | POA: Diagnosis not present

## 2021-12-25 ENCOUNTER — Emergency Department (HOSPITAL_COMMUNITY)
Admission: EM | Admit: 2021-12-25 | Discharge: 2021-12-25 | Discharge status: Against Medical Advice | Payer: BC Managed Care – PPO

## 2021-12-25 DIAGNOSIS — M25561 Pain in right knee: Secondary | ICD-10-CM | POA: Diagnosis not present

## 2021-12-25 DIAGNOSIS — D72829 Elevated white blood cell count, unspecified: Secondary | ICD-10-CM | POA: Diagnosis not present

## 2021-12-25 DIAGNOSIS — M7989 Other specified soft tissue disorders: Secondary | ICD-10-CM | POA: Diagnosis not present

## 2021-12-25 DIAGNOSIS — Z720 Tobacco use: Secondary | ICD-10-CM | POA: Diagnosis not present

## 2021-12-25 DIAGNOSIS — D529 Folate deficiency anemia, unspecified: Secondary | ICD-10-CM | POA: Diagnosis not present

## 2021-12-25 DIAGNOSIS — R200 Anesthesia of skin: Secondary | ICD-10-CM | POA: Diagnosis not present

## 2021-12-30 DIAGNOSIS — E538 Deficiency of other specified B group vitamins: Secondary | ICD-10-CM | POA: Diagnosis not present

## 2021-12-30 DIAGNOSIS — M25561 Pain in right knee: Secondary | ICD-10-CM | POA: Diagnosis not present

## 2021-12-30 DIAGNOSIS — D72829 Elevated white blood cell count, unspecified: Secondary | ICD-10-CM | POA: Diagnosis not present

## 2022-01-07 DIAGNOSIS — M1711 Unilateral primary osteoarthritis, right knee: Secondary | ICD-10-CM | POA: Diagnosis not present

## 2022-01-07 DIAGNOSIS — M25561 Pain in right knee: Secondary | ICD-10-CM | POA: Diagnosis not present

## 2022-01-17 DIAGNOSIS — M25561 Pain in right knee: Secondary | ICD-10-CM | POA: Diagnosis not present

## 2022-02-04 DIAGNOSIS — M25661 Stiffness of right knee, not elsewhere classified: Secondary | ICD-10-CM | POA: Diagnosis not present

## 2022-02-04 DIAGNOSIS — R29898 Other symptoms and signs involving the musculoskeletal system: Secondary | ICD-10-CM | POA: Diagnosis not present

## 2022-02-04 DIAGNOSIS — M25561 Pain in right knee: Secondary | ICD-10-CM | POA: Diagnosis not present

## 2022-02-04 DIAGNOSIS — Z7409 Other reduced mobility: Secondary | ICD-10-CM | POA: Diagnosis not present

## 2022-02-11 DIAGNOSIS — R29898 Other symptoms and signs involving the musculoskeletal system: Secondary | ICD-10-CM | POA: Diagnosis not present

## 2022-02-11 DIAGNOSIS — M25561 Pain in right knee: Secondary | ICD-10-CM | POA: Diagnosis not present

## 2022-02-11 DIAGNOSIS — M25661 Stiffness of right knee, not elsewhere classified: Secondary | ICD-10-CM | POA: Diagnosis not present

## 2022-02-11 DIAGNOSIS — Z7409 Other reduced mobility: Secondary | ICD-10-CM | POA: Diagnosis not present

## 2022-02-12 DIAGNOSIS — M25561 Pain in right knee: Secondary | ICD-10-CM | POA: Diagnosis not present

## 2022-02-17 DIAGNOSIS — M25561 Pain in right knee: Secondary | ICD-10-CM | POA: Diagnosis not present

## 2022-02-17 DIAGNOSIS — M25661 Stiffness of right knee, not elsewhere classified: Secondary | ICD-10-CM | POA: Diagnosis not present

## 2022-02-17 DIAGNOSIS — R29898 Other symptoms and signs involving the musculoskeletal system: Secondary | ICD-10-CM | POA: Diagnosis not present

## 2022-02-17 DIAGNOSIS — Z7409 Other reduced mobility: Secondary | ICD-10-CM | POA: Diagnosis not present

## 2022-02-19 DIAGNOSIS — R29898 Other symptoms and signs involving the musculoskeletal system: Secondary | ICD-10-CM | POA: Diagnosis not present

## 2022-02-19 DIAGNOSIS — M25561 Pain in right knee: Secondary | ICD-10-CM | POA: Diagnosis not present

## 2022-02-19 DIAGNOSIS — M25661 Stiffness of right knee, not elsewhere classified: Secondary | ICD-10-CM | POA: Diagnosis not present

## 2022-02-19 DIAGNOSIS — Z7409 Other reduced mobility: Secondary | ICD-10-CM | POA: Diagnosis not present

## 2022-02-26 DIAGNOSIS — R29898 Other symptoms and signs involving the musculoskeletal system: Secondary | ICD-10-CM | POA: Diagnosis not present

## 2022-02-26 DIAGNOSIS — Z7409 Other reduced mobility: Secondary | ICD-10-CM | POA: Diagnosis not present

## 2022-02-26 DIAGNOSIS — M25661 Stiffness of right knee, not elsewhere classified: Secondary | ICD-10-CM | POA: Diagnosis not present

## 2022-02-26 DIAGNOSIS — M25561 Pain in right knee: Secondary | ICD-10-CM | POA: Diagnosis not present

## 2022-02-28 DIAGNOSIS — Z7409 Other reduced mobility: Secondary | ICD-10-CM | POA: Diagnosis not present

## 2022-02-28 DIAGNOSIS — M25561 Pain in right knee: Secondary | ICD-10-CM | POA: Diagnosis not present

## 2022-02-28 DIAGNOSIS — M25661 Stiffness of right knee, not elsewhere classified: Secondary | ICD-10-CM | POA: Diagnosis not present

## 2022-02-28 DIAGNOSIS — R29898 Other symptoms and signs involving the musculoskeletal system: Secondary | ICD-10-CM | POA: Diagnosis not present

## 2022-03-06 DIAGNOSIS — R29898 Other symptoms and signs involving the musculoskeletal system: Secondary | ICD-10-CM | POA: Diagnosis not present

## 2022-03-06 DIAGNOSIS — Z7409 Other reduced mobility: Secondary | ICD-10-CM | POA: Diagnosis not present

## 2022-03-06 DIAGNOSIS — M25561 Pain in right knee: Secondary | ICD-10-CM | POA: Diagnosis not present

## 2022-03-06 DIAGNOSIS — M25661 Stiffness of right knee, not elsewhere classified: Secondary | ICD-10-CM | POA: Diagnosis not present

## 2022-03-10 DIAGNOSIS — M25661 Stiffness of right knee, not elsewhere classified: Secondary | ICD-10-CM | POA: Diagnosis not present

## 2022-03-10 DIAGNOSIS — Z7409 Other reduced mobility: Secondary | ICD-10-CM | POA: Diagnosis not present

## 2022-03-10 DIAGNOSIS — R29898 Other symptoms and signs involving the musculoskeletal system: Secondary | ICD-10-CM | POA: Diagnosis not present

## 2022-03-10 DIAGNOSIS — M25561 Pain in right knee: Secondary | ICD-10-CM | POA: Diagnosis not present

## 2022-03-12 DIAGNOSIS — M25661 Stiffness of right knee, not elsewhere classified: Secondary | ICD-10-CM | POA: Diagnosis not present

## 2022-03-12 DIAGNOSIS — M25561 Pain in right knee: Secondary | ICD-10-CM | POA: Diagnosis not present

## 2022-03-12 DIAGNOSIS — Z7409 Other reduced mobility: Secondary | ICD-10-CM | POA: Diagnosis not present

## 2022-03-12 DIAGNOSIS — R29898 Other symptoms and signs involving the musculoskeletal system: Secondary | ICD-10-CM | POA: Diagnosis not present

## 2022-03-26 DIAGNOSIS — R29898 Other symptoms and signs involving the musculoskeletal system: Secondary | ICD-10-CM | POA: Diagnosis not present

## 2022-03-26 DIAGNOSIS — M25561 Pain in right knee: Secondary | ICD-10-CM | POA: Diagnosis not present

## 2022-03-26 DIAGNOSIS — M25661 Stiffness of right knee, not elsewhere classified: Secondary | ICD-10-CM | POA: Diagnosis not present

## 2022-03-26 DIAGNOSIS — Z7409 Other reduced mobility: Secondary | ICD-10-CM | POA: Diagnosis not present

## 2022-03-28 DIAGNOSIS — Z7409 Other reduced mobility: Secondary | ICD-10-CM | POA: Diagnosis not present

## 2022-03-28 DIAGNOSIS — M25661 Stiffness of right knee, not elsewhere classified: Secondary | ICD-10-CM | POA: Diagnosis not present

## 2022-03-28 DIAGNOSIS — M25561 Pain in right knee: Secondary | ICD-10-CM | POA: Diagnosis not present

## 2022-03-28 DIAGNOSIS — G8929 Other chronic pain: Secondary | ICD-10-CM | POA: Diagnosis not present

## 2022-03-28 DIAGNOSIS — R29898 Other symptoms and signs involving the musculoskeletal system: Secondary | ICD-10-CM | POA: Diagnosis not present

## 2022-04-02 DIAGNOSIS — M25561 Pain in right knee: Secondary | ICD-10-CM | POA: Diagnosis not present

## 2022-04-02 DIAGNOSIS — M25661 Stiffness of right knee, not elsewhere classified: Secondary | ICD-10-CM | POA: Diagnosis not present

## 2022-04-02 DIAGNOSIS — Z7409 Other reduced mobility: Secondary | ICD-10-CM | POA: Diagnosis not present

## 2022-04-02 DIAGNOSIS — R29898 Other symptoms and signs involving the musculoskeletal system: Secondary | ICD-10-CM | POA: Diagnosis not present

## 2022-04-04 DIAGNOSIS — R29898 Other symptoms and signs involving the musculoskeletal system: Secondary | ICD-10-CM | POA: Diagnosis not present

## 2022-04-04 DIAGNOSIS — Z7409 Other reduced mobility: Secondary | ICD-10-CM | POA: Diagnosis not present

## 2022-04-04 DIAGNOSIS — M25561 Pain in right knee: Secondary | ICD-10-CM | POA: Diagnosis not present

## 2022-04-04 DIAGNOSIS — M25661 Stiffness of right knee, not elsewhere classified: Secondary | ICD-10-CM | POA: Diagnosis not present

## 2022-06-06 ENCOUNTER — Encounter (HOSPITAL_COMMUNITY): Payer: Self-pay | Admitting: Emergency Medicine

## 2022-06-06 ENCOUNTER — Emergency Department (HOSPITAL_COMMUNITY)
Admission: EM | Admit: 2022-06-06 | Discharge: 2022-06-07 | Discharge status: Against Medical Advice | Payer: BC Managed Care – PPO | Attending: Emergency Medicine | Admitting: Emergency Medicine

## 2022-06-06 ENCOUNTER — Other Ambulatory Visit: Payer: Self-pay

## 2022-06-06 ENCOUNTER — Emergency Department (HOSPITAL_COMMUNITY): Payer: BC Managed Care – PPO

## 2022-06-06 DIAGNOSIS — R0789 Other chest pain: Secondary | ICD-10-CM | POA: Diagnosis not present

## 2022-06-06 DIAGNOSIS — R079 Chest pain, unspecified: Secondary | ICD-10-CM | POA: Diagnosis not present

## 2022-06-06 DIAGNOSIS — N8301 Follicular cyst of right ovary: Secondary | ICD-10-CM | POA: Diagnosis not present

## 2022-06-06 DIAGNOSIS — R109 Unspecified abdominal pain: Secondary | ICD-10-CM | POA: Insufficient documentation

## 2022-06-06 DIAGNOSIS — R1031 Right lower quadrant pain: Secondary | ICD-10-CM | POA: Diagnosis not present

## 2022-06-06 DIAGNOSIS — Z5321 Procedure and treatment not carried out due to patient leaving prior to being seen by health care provider: Secondary | ICD-10-CM | POA: Insufficient documentation

## 2022-06-06 LAB — I-STAT BETA HCG BLOOD, ED (MC, WL, AP ONLY): I-stat hCG, quantitative: <5 m[IU]/mL (ref <5)

## 2022-06-06 LAB — URINALYSIS, ROUTINE W REFLEX MICROSCOPIC
Bilirubin Urine: NEGATIVE
Glucose, UA: NEGATIVE mg/dL
Ketones, ur: NEGATIVE mg/dL
Nitrite: NEGATIVE
Protein, ur: NEGATIVE mg/dL
Specific Gravity, Urine: 1.025 (ref 1.005–1.030)
pH: 6 (ref 5.0–8.0)

## 2022-06-06 LAB — CBC
HCT: 45.3 % (ref 36.0–46.0)
Hemoglobin: 14.6 g/dL (ref 12.0–15.0)
MCH: 31.3 pg (ref 26.0–34.0)
MCHC: 32.2 g/dL (ref 30.0–36.0)
MCV: 97 fL (ref 80.0–100.0)
Platelets: 253 10*3/uL (ref 150–400)
RBC: 4.67 MIL/uL (ref 3.87–5.11)
RDW: 12.1 % (ref 11.5–15.5)
WBC: 13.1 10*3/uL — ABNORMAL HIGH (ref 4.0–10.5)
nRBC: 0 % (ref 0.0–0.2)

## 2022-06-06 LAB — COMPREHENSIVE METABOLIC PANEL
ALT: 16 U/L (ref 0–44)
AST: 21 U/L (ref 15–41)
Albumin: 3.8 g/dL (ref 3.5–5.0)
Alkaline Phosphatase: 54 U/L (ref 38–126)
Anion gap: 7 (ref 5–15)
BUN: 10 mg/dL (ref 6–20)
CO2: 21 mmol/L — ABNORMAL LOW (ref 22–32)
Calcium: 8.8 mg/dL — ABNORMAL LOW (ref 8.9–10.3)
Chloride: 110 mmol/L (ref 98–111)
Creatinine, Ser: 0.96 mg/dL (ref 0.44–1.00)
GFR, Estimated: >60 mL/min (ref >60)
Glucose, Bld: 80 mg/dL (ref 70–99)
Potassium: 4 mmol/L (ref 3.5–5.1)
Sodium: 138 mmol/L (ref 135–145)
Total Bilirubin: 0.3 mg/dL (ref 0.3–1.2)
Total Protein: 7 g/dL (ref 6.5–8.1)

## 2022-06-06 LAB — LIPASE, BLOOD: Lipase: 41 U/L (ref 11–51)

## 2022-06-06 NOTE — ED Provider Triage Note (Signed)
Emergency Medicine Provider Triage Evaluation Note  Zarria Towell , a 26 y.o. female  was evaluated in triage.  Pt complains of chest pain and abdominal pain.  Patient states she started having symptoms over the weekend.  Initially she was having chest discomfort.  Seem to improve and then she started developing abdominal pain primarily in the lower abdomen on the right side.  It radiates down her leg.  She then started developing chest discomfort again.  No known fevers.  Review of Systems  Positive: Chest pain and abdominal pain Negative: No fever  Physical Exam  There were no vitals taken for this visit. Gen:   Awake, no distress   Resp:  Normal effort  MSK:   Moves extremities without difficulty  Other:  , Abdomen tender to palpation right lower quadrant  Medical Decision Making  Medically screening exam initiated at 5:30 PM.  Appropriate orders placed.  Maryn Freelove was informed that the remainder of the evaluation will be completed by another provider, this initial triage assessment does not replace that evaluation, and the importance of remaining in the ED until their evaluation is complete.  We will proceed with lab x-rays, urine ecg   Dorie Rank, MD 06/06/22 1733

## 2022-06-06 NOTE — ED Triage Notes (Signed)
Patient c/o right lower abdominal pain since the weekend. Reports 3 episodes of emesis since pain started and feeling nauseated.

## 2022-06-07 ENCOUNTER — Other Ambulatory Visit: Payer: Self-pay

## 2022-06-07 ENCOUNTER — Ambulatory Visit (HOSPITAL_COMMUNITY)
Admission: EM | Admit: 2022-06-07 | Discharge: 2022-06-07 | Disposition: A | Discharge status: Other Acute Inpatient Hospital | Payer: BC Managed Care – PPO

## 2022-06-07 ENCOUNTER — Emergency Department (HOSPITAL_COMMUNITY): Payer: BC Managed Care – PPO

## 2022-06-07 ENCOUNTER — Encounter (HOSPITAL_COMMUNITY): Payer: Self-pay | Admitting: *Deleted

## 2022-06-07 ENCOUNTER — Emergency Department (HOSPITAL_COMMUNITY)
Admission: EM | Admit: 2022-06-07 | Discharge: 2022-06-07 | Disposition: A | Discharge status: Home / Self Care | Payer: BC Managed Care – PPO | Source: Home / Self Care | Attending: Emergency Medicine | Admitting: Emergency Medicine

## 2022-06-07 ENCOUNTER — Encounter (HOSPITAL_COMMUNITY): Payer: Self-pay

## 2022-06-07 DIAGNOSIS — N8301 Follicular cyst of right ovary: Secondary | ICD-10-CM | POA: Diagnosis not present

## 2022-06-07 DIAGNOSIS — R079 Chest pain, unspecified: Secondary | ICD-10-CM | POA: Insufficient documentation

## 2022-06-07 DIAGNOSIS — R1031 Right lower quadrant pain: Secondary | ICD-10-CM

## 2022-06-07 DIAGNOSIS — R100 Acute abdomen: Secondary | ICD-10-CM

## 2022-06-07 LAB — URINALYSIS, ROUTINE W REFLEX MICROSCOPIC
Bacteria, UA: NONE SEEN
Bilirubin Urine: NEGATIVE
Glucose, UA: NEGATIVE mg/dL
Ketones, ur: NEGATIVE mg/dL
Nitrite: NEGATIVE
Protein, ur: NEGATIVE mg/dL
RBC / HPF: >50 RBC/hpf — ABNORMAL HIGH (ref 0–5)
Specific Gravity, Urine: 1.02 (ref 1.005–1.030)
pH: 7 (ref 5.0–8.0)

## 2022-06-07 LAB — CBC WITH DIFFERENTIAL/PLATELET
Abs Immature Granulocytes: 0.02 10*3/uL (ref 0.00–0.07)
Basophils Absolute: 0.1 10*3/uL (ref 0.0–0.1)
Basophils Relative: 1 %
Eosinophils Absolute: 0.5 10*3/uL (ref 0.0–0.5)
Eosinophils Relative: 4 %
HCT: 44.2 % (ref 36.0–46.0)
Hemoglobin: 14.9 g/dL (ref 12.0–15.0)
Immature Granulocytes: 0 %
Lymphocytes Relative: 36 %
Lymphs Abs: 4.4 10*3/uL — ABNORMAL HIGH (ref 0.7–4.0)
MCH: 31.9 pg (ref 26.0–34.0)
MCHC: 33.7 g/dL (ref 30.0–36.0)
MCV: 94.6 fL (ref 80.0–100.0)
Monocytes Absolute: 1 10*3/uL (ref 0.1–1.0)
Monocytes Relative: 8 %
Neutro Abs: 6.4 10*3/uL (ref 1.7–7.7)
Neutrophils Relative %: 51 %
Platelets: 268 10*3/uL (ref 150–400)
RBC: 4.67 MIL/uL (ref 3.87–5.11)
RDW: 12 % (ref 11.5–15.5)
WBC: 12.3 10*3/uL — ABNORMAL HIGH (ref 4.0–10.5)
nRBC: 0 % (ref 0.0–0.2)

## 2022-06-07 LAB — COMPREHENSIVE METABOLIC PANEL
ALT: 15 U/L (ref 0–44)
AST: 19 U/L (ref 15–41)
Albumin: 4.2 g/dL (ref 3.5–5.0)
Alkaline Phosphatase: 59 U/L (ref 38–126)
Anion gap: 6 (ref 5–15)
BUN: 12 mg/dL (ref 6–20)
CO2: 26 mmol/L (ref 22–32)
Calcium: 9.4 mg/dL (ref 8.9–10.3)
Chloride: 109 mmol/L (ref 98–111)
Creatinine, Ser: 0.88 mg/dL (ref 0.44–1.00)
GFR, Estimated: >60 mL/min (ref >60)
Glucose, Bld: 82 mg/dL (ref 70–99)
Potassium: 3.8 mmol/L (ref 3.5–5.1)
Sodium: 141 mmol/L (ref 135–145)
Total Bilirubin: 0.6 mg/dL (ref 0.3–1.2)
Total Protein: 7.8 g/dL (ref 6.5–8.1)

## 2022-06-07 LAB — TROPONIN I (HIGH SENSITIVITY)
Troponin I (High Sensitivity): <2 ng/L (ref <18)
Troponin I (High Sensitivity): <2 ng/L (ref <18)

## 2022-06-07 LAB — LIPASE, BLOOD: Lipase: 42 U/L (ref 11–51)

## 2022-06-07 LAB — PREGNANCY, URINE: Preg Test, Ur: NEGATIVE

## 2022-06-07 MED ORDER — IOHEXOL 300 MG/ML  SOLN
100.0000 mL | Freq: Once | INTRAMUSCULAR | Status: AC | PRN
  Administered 2022-06-07: 100 mL via INTRAVENOUS

## 2022-06-07 MED ORDER — SODIUM CHLORIDE 0.9 % IV BOLUS
500.0000 mL | Freq: Once | INTRAVENOUS | Status: AC
  Administered 2022-06-07: 500 mL via INTRAVENOUS

## 2022-06-07 MED ORDER — IBUPROFEN 800 MG PO TABS: 800.0000 mg | ORAL_TABLET | Freq: Three times a day (TID) | ORAL | 0 refills | Status: AC | PRN

## 2022-06-07 MED ORDER — ONDANSETRON HCL 4 MG/2ML IJ SOLN
4.0000 mg | Freq: Once | INTRAMUSCULAR | Status: AC
  Administered 2022-06-07: 4 mg via INTRAVENOUS
  Filled 2022-06-07: qty 2

## 2022-06-07 MED ORDER — MORPHINE SULFATE (PF) 4 MG/ML IV SOLN
4.0000 mg | Freq: Once | INTRAVENOUS | Status: AC
  Administered 2022-06-07: 4 mg via INTRAVENOUS
  Filled 2022-06-07: qty 1

## 2022-06-07 NOTE — Discharge Instructions (Signed)

## 2022-06-07 NOTE — ED Triage Notes (Signed)
Patient reports that she began having chest pain 5 days ago, but none today. Patient also reports that she began having RLQ abdominal pain x 4 days.  Patient went to [facility] ED last night, but left due to increased abdominal pain.  Today, the patient reports that he chest pain has resolved, but continues to have RLQ abdominal pain. Patient went to a [facility] and was referred to the ED.

## 2022-06-07 NOTE — Discharge Instructions (Addendum)
D/C to ED via POV 

## 2022-06-07 NOTE — ED Triage Notes (Signed)
Pt reports ongoing pain to RLQ ABD. Pt reports going to ED last night but left because the RLQ ABD pain was so sever she could not wait . Pt presents today with same ABD pain. Pain 8/10.

## 2022-06-07 NOTE — ED Provider Notes (Signed)
Emergency Department Provider Note   I have reviewed the triage vital signs and the nursing notes.   HISTORY  Chief Complaint Abdominal Pain, Chest Pain, and Nausea   HPI Abigail Erickson is a 26 y.o. female with past history reviewed below including prior ovarian cyst, anxiety/depression presents to the emergency department with lower abdominal pain and intermittent chest discomfort.  Patient states she has episodes of chest discomfort to sometimes dull, sometimes sharp, lasting for only several seconds.  No clear provoking factors.  Last episode of chest pain was 5 days ago but presents to the ED after having more persistent right lower quadrant abdominal pain.  She describes a cute onset of pain in the right lower abdomen without significant radiation.  She has started her menstrual cycle and says having some vaginal bleeding but no discharge.  No dysuria, hesitancy, urgency.  No fevers or chills.  She does have prior history of ovarian cyst requiring excision states this pain is not typical of that.  No shortness of breath, cough, congestion.  She presented to the [facility] emergency department yesterday and had lab work from the waiting room but ultimately left without being seen.  This morning, she went to urgent care and was referred here after review of lab work showing leukocytosis with continued right lower quadrant abdominal pain.   Past Medical History:  Diagnosis Date   Anxiety    Depression     Review of Systems  Constitutional: No fever/chills Eyes: No visual changes. ENT: No sore throat. Cardiovascular: Denies chest pain. Respiratory: Denies shortness of breath. Gastrointestinal: Positive RLQ abdominal pain.  No nausea, no vomiting.  No diarrhea.  No constipation. Genitourinary: Negative for dysuria. Musculoskeletal: Negative for back pain. Skin: Negative for rash. Neurological: Negative for headaches, focal weakness or  numbness.   ____________________________________________   PHYSICAL EXAM:  VITAL SIGNS: ED Triage Vitals  Enc Vitals Group     BP 06/07/22 1407 112/69     Pulse Rate 06/07/22 1407 70     Resp 06/07/22 1407 18     Temp 06/07/22 1407 98.4 F (36.9 C)     Temp Source 06/07/22 1407 Oral     SpO2 06/07/22 1407 100 %     Weight 06/07/22 1413 220 lb (99.8 kg)     Height 06/07/22 1413 5\' 2"  (1.575 m)   Constitutional: Alert and oriented. Well appearing and in no acute distress. Eyes: Conjunctivae are normal. Head: Atraumatic. Nose: No congestion/rhinnorhea. Mouth/Throat: Mucous membranes are moist.   Neck: No stridor.   Cardiovascular: Normal rate, regular rhythm. Good peripheral circulation. Grossly normal heart sounds.   Respiratory: Normal respiratory effort.  No retractions. Lungs CTAB. Gastrointestinal: Soft with mild lower abdominal tenderness more focal on the right lower abdomen. No peritonitis. No distention.  Musculoskeletal: No lower extremity tenderness nor edema. No gross deformities of extremities. Neurologic:  Normal speech and language. No gross focal neurologic deficits are appreciated.  Skin:  Skin is warm, dry and intact. No rash noted.  ____________________________________________   LABS (all labs ordered are listed, but only abnormal results are displayed)  Labs Reviewed  CBC WITH DIFFERENTIAL/PLATELET - Abnormal; Notable for the following components:      Result Value   WBC 12.3 (*)    Lymphs Abs 4.4 (*)    All other components within normal limits  URINALYSIS, ROUTINE W REFLEX MICROSCOPIC - Abnormal; Notable for the following components:   APPearance HAZY (*)    Hgb urine dipstick LARGE (*)  Leukocytes,Ua TRACE (*)    RBC / HPF >50 (*)    All other components within normal limits  COMPREHENSIVE METABOLIC PANEL  LIPASE, BLOOD  PREGNANCY, URINE  TROPONIN I (HIGH SENSITIVITY)  TROPONIN I (HIGH SENSITIVITY)    ____________________________________________  EKG   EKG Interpretation  Date/Time:  Saturday June 07 2022 14:39:56 EDT Ventricular Rate:  77 PR Interval:  129 QRS Duration: 87 QT Interval:  366 QTC Calculation: 415 R Axis:   59 Text Interpretation: Sinus rhythm Confirmed by Alona Bene [phone]) on 06/07/2022 2:43:29 PM        ____________________________________________  RADIOLOGY  US Pelvis Complete  Result Date: 06/07/2022 CLINICAL DATA:  Initial evaluation for acute right lower quadrant pain. EXAM: TRANSABDOMINAL AND TRANSVAGINAL ULTRASOUND OF PELVIS DOPPLER ULTRASOUND OF OVARIES TECHNIQUE: Both transabdominal and transvaginal ultrasound examinations of the pelvis were performed. Transabdominal technique was performed for global imaging of the pelvis including uterus, ovaries, adnexal regions, and pelvic cul-de-sac. It was necessary to proceed with endovaginal exam following the transabdominal exam to visualize the endometrium. Color and duplex Doppler ultrasound was utilized to evaluate blood flow to the ovaries. COMPARISON:  CT from earlier the same day. FINDINGS: Uterus Measurements: 8.4 x 4.2 x 6.1 cm = volume: 113.4 mL. Uterus is anteverted. No discrete fibroid or other abnormality. Endometrium IUD in appropriate position within the endometrial cavity. Endometrial stripe not well assessed due to adjacent IUD. Right ovary Measurements: 5.3 x 2.8 x 4.0 cm = volume: 30.6 mL. 2.2 cm dominant follicle noted. No adnexal mass. Left ovary Measurements: 2.6 x 2.0 x 1.8 cm = volume: 4.2 mL. Normal appearance/no adnexal mass. Pulsed Doppler evaluation of both ovaries demonstrates normal low-resistance arterial and venous waveforms. Other findings No abnormal free fluid. IMPRESSION: 1. 2.2 cm dominant right ovarian follicle. 2. Otherwise unremarkable pelvic ultrasound. No evidence for torsion or other acute abnormality. 3. IUD in appropriate position within the endometrial cavity.  Electronically Signed   By: Rise Mu M.D.   On: 06/07/2022 19:21   US Transvaginal Non-OB  Result Date: 06/07/2022 CLINICAL DATA:  Initial evaluation for acute right lower quadrant pain. EXAM: TRANSABDOMINAL AND TRANSVAGINAL ULTRASOUND OF PELVIS DOPPLER ULTRASOUND OF OVARIES TECHNIQUE: Both transabdominal and transvaginal ultrasound examinations of the pelvis were performed. Transabdominal technique was performed for global imaging of the pelvis including uterus, ovaries, adnexal regions, and pelvic cul-de-sac. It was necessary to proceed with endovaginal exam following the transabdominal exam to visualize the endometrium. Color and duplex Doppler ultrasound was utilized to evaluate blood flow to the ovaries. COMPARISON:  CT from earlier the same day. FINDINGS: Uterus Measurements: 8.4 x 4.2 x 6.1 cm = volume: 113.4 mL. Uterus is anteverted. No discrete fibroid or other abnormality. Endometrium IUD in appropriate position within the endometrial cavity. Endometrial stripe not well assessed due to adjacent IUD. Right ovary Measurements: 5.3 x 2.8 x 4.0 cm = volume: 30.6 mL. 2.2 cm dominant follicle noted. No adnexal mass. Left ovary Measurements: 2.6 x 2.0 x 1.8 cm = volume: 4.2 mL. Normal appearance/no adnexal mass. Pulsed Doppler evaluation of both ovaries demonstrates normal low-resistance arterial and venous waveforms. Other findings No abnormal free fluid. IMPRESSION: 1. 2.2 cm dominant right ovarian follicle. 2. Otherwise unremarkable pelvic ultrasound. No evidence for torsion or other acute abnormality. 3. IUD in appropriate position within the endometrial cavity. Electronically Signed   By: Rise Mu M.D.   On: 06/07/2022 19:21   [place] Art/Ven Flow Abd Pelv Doppler  Result Date: 06/07/2022 CLINICAL DATA:  Initial evaluation for acute right lower quadrant pain. EXAM: TRANSABDOMINAL AND TRANSVAGINAL ULTRASOUND OF PELVIS DOPPLER ULTRASOUND OF OVARIES TECHNIQUE: Both transabdominal and  transvaginal ultrasound examinations of the pelvis were performed. Transabdominal technique was performed for global imaging of the pelvis including uterus, ovaries, adnexal regions, and pelvic cul-de-sac. It was necessary to proceed with endovaginal exam following the transabdominal exam to visualize the endometrium. Color and duplex Doppler ultrasound was utilized to evaluate blood flow to the ovaries. COMPARISON:  CT from earlier the same day. FINDINGS: Uterus Measurements: 8.4 x 4.2 x 6.1 cm = volume: 113.4 mL. Uterus is anteverted. No discrete fibroid or other abnormality. Endometrium IUD in appropriate position within the endometrial cavity. Endometrial stripe not well assessed due to adjacent IUD. Right ovary Measurements: 5.3 x 2.8 x 4.0 cm = volume: 30.6 mL. 2.2 cm dominant follicle noted. No adnexal mass. Left ovary Measurements: 2.6 x 2.0 x 1.8 cm = volume: 4.2 mL. Normal appearance/no adnexal mass. Pulsed Doppler evaluation of both ovaries demonstrates normal low-resistance arterial and venous waveforms. Other findings No abnormal free fluid. IMPRESSION: 1. 2.2 cm dominant right ovarian follicle. 2. Otherwise unremarkable pelvic ultrasound. No evidence for torsion or other acute abnormality. 3. IUD in appropriate position within the endometrial cavity. Electronically Signed   By: Rise Mu M.D.   On: 06/07/2022 19:21   CT Abdomen Pelvis W Contrast  Result Date: 06/07/2022 CLINICAL DATA:  Right lower quadrant pain EXAM: CT ABDOMEN AND PELVIS WITH CONTRAST TECHNIQUE: Multidetector CT imaging of the abdomen and pelvis was performed using the standard protocol following bolus administration of intravenous contrast. RADIATION DOSE REDUCTION: This exam was performed according to the departmental dose-optimization program which includes automated exposure control, adjustment of the mA and/or kV according to patient size and/or use of iterative reconstruction technique. CONTRAST:  OMNIPAQUE  IOHEXOL 300 MG/ML  SOLN COMPARISON:  None Available. FINDINGS: Lower chest: The visualized bibasilar lungs clear. Normal heart size no pericardial effusion. Hepatobiliary: No focal liver abnormality is seen. No gallstones, gallbladder wall thickening, or biliary dilatation. Pancreas: Unremarkable. No pancreatic ductal dilatation or surrounding inflammatory changes. Spleen: Normal in size without focal abnormality. Adrenals/Urinary Tract: Adrenal glands are unremarkable. Kidneys are normal, without renal calculi, focal lesion, or hydronephrosis. Bladder is unremarkable. Stomach/Bowel: Stomach is within normal limits. Appendix appears normal. No evidence of bowel wall thickening, distention, or inflammatory changes. Vascular/Lymphatic: No significant vascular findings are present. No enlarged abdominal or pelvic lymph nodes. Reproductive: Uterus and bilateral adnexa are unremarkable. Other: No abdominal wall hernia or abnormality. No abdominopelvic ascites. Musculoskeletal: No acute or significant osseous findings. IMPRESSION: No etiology for right lower quadrant pain identified. Electronically Signed   By: Jacob Moores M.D.   On: 06/07/2022 17:31    ____________________________________________   PROCEDURES  Procedure(s) performed:   Procedures  None ____________________________________________   INITIAL IMPRESSION / ASSESSMENT AND PLAN / ED COURSE  Pertinent labs & imaging results that were available during my care of the patient were reviewed by me and considered in my medical decision making (see chart for details).   This patient is Presenting for Evaluation of abdominal pain, which does require a range of treatment options, and is a complaint that involves a high risk of morbidity and mortality.  The Differential Diagnoses includes but is not exclusive to ectopic pregnancy, ovarian cyst, ovarian torsion, acute appendicitis, urinary tract infection, endometriosis, bowel obstruction,  hernia, colitis, renal colic, gastroenteritis, volvulus etc.   Critical Interventions-    Medications  sodium chloride  0.9 % bolus 500 mL (0 mLs Intravenous Stopped 06/07/22 2014)  morphine (PF) 4 MG/ML injection 4 mg (4 mg Intravenous Given 06/07/22 1545)  ondansetron (ZOFRAN) injection 4 mg (4 mg Intravenous Given 06/07/22 1545)  iohexol (OMNIPAQUE) 300 MG/ML solution 100 mL (100 mLs Intravenous Contrast Given 06/07/22 1715)    Reassessment after intervention: Symptoms improved.    I decided to review pertinent External Data, and in summary patient with [facility] note from [facility] yesterday and UC note from this AM.    Clinical Laboratory Tests Ordered, included improving leukocytosis.  Troponin normal.  UA without evidence of clear urine infection.  Plan to send for culture.  LFTs and bilirubin normal.  Pregnancy negative.  Radiologic Tests Ordered, included CT abdomen/pelvis along with pelvic US. I independently interpreted the images and agree with radiology interpretation.   Cardiac Monitor Tracing which shows NSR.    Social Determinants of Health Risk no active smoking history.   Medical Decision Making: Summary:  Patient presents to the ED with mainly right lower quadrant abdominal pain.  Some fairly atypical pain over the past week but nothing active.  EKG reassuring.  Plan for troponin for further evaluation but primarily patient will need CT imaging of the abdomen pelvis to rule out acute appendicitis.  Suspicion for torsion although considered.   Reevaluation with update and discussion with patient.  The CT imaging and ultrasounds are reassuring with no acute findings.  Discussed other etiologies such as sexually-transmitted infections but patient not having symptoms or concern for exposure.  Discussed pelvic exam but patient would like to defer and follow-up with her OB/GYN who she plans to call on Monday.   Considered admission but no acute surgical process in the abdomen or other lab  abnormality requiring admit.  Plan for symptom management and OB/GYN follow-up.  Disposition: discharge  ____________________________________________  FINAL CLINICAL IMPRESSION(S) / ED DIAGNOSES  Final diagnoses:  RLQ abdominal pain    NEW OUTPATIENT MEDICATIONS STARTED DURING THIS VISIT:  Discharge Medication List as of 06/07/2022  8:10 PM     START taking these medications   Details  ibuprofen (ADVIL) 800 MG tablet Take 1 tablet (800 mg total) by mouth every 8 (eight) hours as needed for moderate pain., Starting Sat 06/07/2022, Normal        Note:  This document was prepared using Dragon voice recognition software and may include unintentional dictation errors.  Nanda Quinton, MD, [facility] Emergency Medicine    Trashawn Oquendo, Wonda Olds, MD 06/07/22 2233

## 2022-06-07 NOTE — ED Provider Notes (Signed)
[facility]    CSN: [id number] Arrival date & time: 06/07/22  1225      History   Chief Complaint Chief Complaint  Patient presents with   Abdominal Pain    HPI Abigail Erickson is a 26 y.o. female.  Presents with RLQ abdominal pain One week history but worsening over last 24 hours Went to ED yesterday but LWBS - lipase normal, WBC 13.1  Reports pain is worse today.  Some nausea and vomiting, decreased appetite Denies urinary symptoms  No fever.  History of R ovarian cyst removal  Past Medical History:  Diagnosis Date   Anxiety    Depression     There are no problems to display for this patient.   Past Surgical History:  Procedure Laterality Date   TONSILLECTOMY     WISDOM TOOTH EXTRACTION      OB History   No obstetric history on file.      Home Medications    Prior to Admission medications   Medication Sig Start Date End Date Taking? Authorizing Provider  cyclobenzaprine (FLEXERIL) 5 MG tablet Take 1 tablet (5 mg total) by mouth at bedtime. 12/24/20   Valinda Hoar, NP  diclofenac (VOLTAREN) 75 MG EC tablet Take 1 tablet (75 mg total) by mouth 2 (two) times daily. 10/05/21   Elson Areas, PA-C  dicyclomine (BENTYL) 20 MG tablet Take 1 tablet (20 mg total) by mouth 2 (two) times daily. 12/24/20   Valinda Hoar, NP  HYDROcodone-acetaminophen (NORCO) 5-325 MG tablet Take 1 tablet by mouth every 4 (four) hours as needed for moderate pain. 08/02/18   Dione Booze, MD  naproxen (NAPROSYN) 500 MG tablet Take 1 tablet (500 mg total) by mouth 2 (two) times daily. 12/24/20   White, Elita Boone, NP  ondansetron (ZOFRAN) 4 MG tablet Take 1 tablet (4 mg total) by mouth every 6 (six) hours. 12/24/20   White, Elita Boone, NP  predniSONE (DELTASONE) 20 MG tablet Take 2 tablets (40 mg total) by mouth daily. Take 40 mg by mouth daily for 3 days, then 20mg  by mouth daily for 3 days, then 10mg  daily for 3 days 05/27/19   , PA-C    Family  History History reviewed. No pertinent family history.  Social History Social History   Tobacco Use   Smoking status: Former    Types: Cigarettes   Smokeless tobacco: Never  Vaping Use   Vaping Use: Every day  Substance Use Topics   Alcohol use: Yes    Comment: occ   Drug use: No     Allergies   Latex   Review of Systems Review of Systems  Gastrointestinal:  Positive for abdominal pain.   Per HPI  Physical Exam Triage Vital Signs ED Triage Vitals  Enc Vitals Group     BP 06/07/22 1239 101/63     Pulse Rate 06/07/22 1239 61     Resp 06/07/22 1239 18     Temp 06/07/22 1239 98.1 F (36.7 C)     Temp src --      SpO2 06/07/22 1239 98 %     Weight --      Height --      Head Circumference --      Peak Flow --      Pain Score 06/07/22 1237 8     Pain Loc --      Pain Edu? --      Excl. in GC? --  No data found.  Updated Vital Signs BP 101/63   Pulse 61   Temp 98.1 F (36.7 C)   Resp 18   SpO2 98%   Physical Exam Vitals and nursing note reviewed.  Constitutional:      Appearance: Normal appearance. She is not toxic-appearing.  HENT:     Mouth/Throat:     Mouth: Mucous membranes are moist.     Pharynx: Oropharynx is clear.  Eyes:     General: No scleral icterus. Cardiovascular:     Rate and Rhythm: Normal rate and regular rhythm.  Pulmonary:     Effort: Pulmonary effort is normal.  Abdominal:     General: Bowel sounds are normal.     Palpations: Abdomen is soft.     Tenderness: There is abdominal tenderness in the right lower quadrant. There is no right CVA tenderness, left CVA tenderness, guarding or rebound. Positive signs include Rovsing's sign and McBurney's sign.  Musculoskeletal:        General: Normal range of motion.  Neurological:     Mental Status: She is alert and oriented to person, place, and time.      UC Treatments / Results  Labs (all labs ordered are listed, but only abnormal results are displayed) Labs Reviewed - No  data to display  EKG   Radiology DG Chest 2 View  Result Date: 06/06/2022 CLINICAL DATA:  Chest pain, abdominal pain EXAM: CHEST - 2 VIEW COMPARISON:  06/05/2018 FINDINGS: Normal heart size, mediastinal contours, and pulmonary vascularity. Lungs clear. No pleural effusion or pneumothorax. Bones unremarkable. IMPRESSION: Normal exam. Electronically Signed   By: Lavonia Dana M.D.   On: 06/06/2022 18:48    Procedures Procedures (including critical care time)  Medications Ordered in UC Medications - No data to display  Initial Impression / Assessment and Plan / UC Course  I have reviewed the triage vital signs and the nursing notes.  Pertinent labs & imaging results that were available during my care of the patient were reviewed by me and considered in my medical decision making (see chart for details).  Patient with +Rovsing and Mcburney signs Concern for appendicitis vs ovarian etiology Recommend evaluation in the emergency department Discussed she needs higher level of care than what can be offered in the urgent care. Patient verbalizes understanding. Discharged to ED via POV.  Final Clinical Impressions(s) / UC Diagnoses   Final diagnoses:  Right lower quadrant abdominal pain  Acute abdomen     Discharge Instructions      D/C to ED via POV     ED Prescriptions   None    PDMP not reviewed this encounter.   Lanell Carpenter, Vernice Jefferson 06/07/22 1311

## 2022-06-07 NOTE — ED Provider Triage Note (Signed)
Emergency Medicine Provider Triage Evaluation Note  Abigail Erickson , a 26 y.o. female  was evaluated in triage.  Pt complains of right lower quadrant abdominal pain and intermittent chest pain.  States chest pain has been going on for the past week.  States it is mostly resolved at this time.  Abdominal pain began 5 days ago and has progressively worsened.  Localizes it to the right lower quadrant with radiation down the right leg.  Also reporting nausea and vomiting.  No diarrhea.  No fevers or chills.  Review of Systems  Positive: As above Negative: As above  Physical Exam  BP 112/69   Pulse 70   Temp 98.4 F (36.9 C) (Oral)   Resp 18   Ht 5\' 2"  (1.575 m)   Wt 99.8 kg   SpO2 100%   BMI 40.24 kg/m  Gen:   Awake, no distress   Resp:  Normal effort  MSK:   Moves extremities without difficulty  Other:  Right lower quadrant exquisitely tender to palpation  Medical Decision Making  Medically screening exam initiated at 2:21 PM.  Appropriate orders placed.  ELLINGTON CORNIA was informed that the remainder of the evaluation will be completed by another provider, this initial triage assessment does not replace that evaluation, and the importance of remaining in the ED until their evaluation is complete.  We will obtain repeat of labs and CT abdomen due to concern for appendicitis   Roylene Reason, PA-C 06/07/22 1423

## 2022-06-10 DIAGNOSIS — N83201 Unspecified ovarian cyst, right side: Secondary | ICD-10-CM | POA: Diagnosis not present

## 2022-06-10 DIAGNOSIS — R1031 Right lower quadrant pain: Secondary | ICD-10-CM | POA: Diagnosis not present

## 2022-07-10 DIAGNOSIS — R102 Pelvic and perineal pain: Secondary | ICD-10-CM | POA: Diagnosis not present

## 2022-07-10 DIAGNOSIS — T385X5A Adverse effect of other estrogens and progestogens, initial encounter: Secondary | ICD-10-CM | POA: Diagnosis not present

## 2022-07-10 DIAGNOSIS — N938 Other specified abnormal uterine and vaginal bleeding: Secondary | ICD-10-CM | POA: Diagnosis not present

## 2022-07-10 DIAGNOSIS — Z30432 Encounter for removal of intrauterine contraceptive device: Secondary | ICD-10-CM | POA: Diagnosis not present

## 2022-07-10 DIAGNOSIS — Z975 Presence of (intrauterine) contraceptive device: Secondary | ICD-10-CM | POA: Diagnosis not present

## 2022-11-18 DIAGNOSIS — R140 Abdominal distension (gaseous): Secondary | ICD-10-CM | POA: Diagnosis not present

## 2022-11-18 DIAGNOSIS — R1084 Generalized abdominal pain: Secondary | ICD-10-CM | POA: Diagnosis not present

## 2022-11-18 DIAGNOSIS — R197 Diarrhea, unspecified: Secondary | ICD-10-CM | POA: Diagnosis not present

## 2022-12-23 ENCOUNTER — Emergency Department (HOSPITAL_COMMUNITY)
Admission: EM | Admit: 2022-12-23 | Discharge: 2022-12-24 | Discharge status: Against Medical Advice | Payer: BC Managed Care – PPO | Attending: Emergency Medicine | Admitting: Emergency Medicine

## 2022-12-23 ENCOUNTER — Other Ambulatory Visit: Payer: Self-pay

## 2022-12-23 DIAGNOSIS — R1031 Right lower quadrant pain: Secondary | ICD-10-CM | POA: Insufficient documentation

## 2022-12-23 DIAGNOSIS — M545 Low back pain, unspecified: Secondary | ICD-10-CM | POA: Diagnosis not present

## 2022-12-23 DIAGNOSIS — Z5321 Procedure and treatment not carried out due to patient leaving prior to being seen by health care provider: Secondary | ICD-10-CM | POA: Diagnosis not present

## 2022-12-23 DIAGNOSIS — R110 Nausea: Secondary | ICD-10-CM | POA: Insufficient documentation

## 2022-12-23 DIAGNOSIS — R350 Frequency of micturition: Secondary | ICD-10-CM | POA: Diagnosis not present

## 2022-12-23 DIAGNOSIS — R197 Diarrhea, unspecified: Secondary | ICD-10-CM | POA: Diagnosis not present

## 2022-12-23 LAB — CBC WITH DIFFERENTIAL/PLATELET
Abs Immature Granulocytes: 0.03 10*3/uL (ref 0.00–0.07)
Basophils Absolute: 0.1 10*3/uL (ref 0.0–0.1)
Basophils Relative: 1 %
Eosinophils Absolute: 0.4 10*3/uL (ref 0.0–0.5)
Eosinophils Relative: 4 %
HCT: 41 % (ref 36.0–46.0)
Hemoglobin: 13.4 g/dL (ref 12.0–15.0)
Immature Granulocytes: 0 %
Lymphocytes Relative: 37 %
Lymphs Abs: 3.5 10*3/uL (ref 0.7–4.0)
MCH: 30.5 pg (ref 26.0–34.0)
MCHC: 32.7 g/dL (ref 30.0–36.0)
MCV: 93.2 fL (ref 80.0–100.0)
Monocytes Absolute: 0.6 10*3/uL (ref 0.1–1.0)
Monocytes Relative: 6 %
Neutro Abs: 4.8 10*3/uL (ref 1.7–7.7)
Neutrophils Relative %: 52 %
Platelets: 277 10*3/uL (ref 150–400)
RBC: 4.4 MIL/uL (ref 3.87–5.11)
RDW: 12.8 % (ref 11.5–15.5)
WBC: 9.3 10*3/uL (ref 4.0–10.5)
nRBC: 0 % (ref 0.0–0.2)

## 2022-12-23 LAB — URINALYSIS, ROUTINE W REFLEX MICROSCOPIC
Bacteria, UA: NONE SEEN
Bilirubin Urine: NEGATIVE
Glucose, UA: NEGATIVE mg/dL
Hgb urine dipstick: NEGATIVE
Ketones, ur: NEGATIVE mg/dL
Nitrite: NEGATIVE
Protein, ur: NEGATIVE mg/dL
Specific Gravity, Urine: 1.023 (ref 1.005–1.030)
pH: 6 (ref 5.0–8.0)

## 2022-12-23 LAB — COMPREHENSIVE METABOLIC PANEL
ALT: 18 U/L (ref 0–44)
AST: 22 U/L (ref 15–41)
Albumin: 3.6 g/dL (ref 3.5–5.0)
Alkaline Phosphatase: 54 U/L (ref 38–126)
Anion gap: 8 (ref 5–15)
BUN: 10 mg/dL (ref 6–20)
CO2: 21 mmol/L — ABNORMAL LOW (ref 22–32)
Calcium: 9.1 mg/dL (ref 8.9–10.3)
Chloride: 109 mmol/L (ref 98–111)
Creatinine, Ser: 0.82 mg/dL (ref 0.44–1.00)
GFR, Estimated: >60 mL/min (ref >60)
Glucose, Bld: 97 mg/dL (ref 70–99)
Potassium: 3.8 mmol/L (ref 3.5–5.1)
Sodium: 138 mmol/L (ref 135–145)
Total Bilirubin: 0.4 mg/dL (ref 0.3–1.2)
Total Protein: 7.1 g/dL (ref 6.5–8.1)

## 2022-12-23 LAB — LIPASE, BLOOD: Lipase: 42 U/L (ref 11–51)

## 2022-12-23 LAB — I-STAT BETA HCG BLOOD, ED (MC, WL, AP ONLY): I-stat hCG, quantitative: <5 m[IU]/mL (ref <5)

## 2022-12-23 NOTE — ED Triage Notes (Signed)
Pt with RLQ pain with radiation to back x 2 weeks, called OBGYN who advised her to come here. Nausea without vomiting, diarrhea yesterday.

## 2022-12-23 NOTE — ED Provider Triage Note (Signed)
Emergency Medicine Provider Triage Evaluation Note  Abigail Erickson , a 27 y.o. female  was evaluated in triage.  Pt complains of RLQ pain onset 1 week ago thought it was.  Cramps however continues and progressively worsening, also pain in right lower back, urinary freq without dysuria.  Associated with loose stools and nausea, prior abdominal surgeries for ovarian cyst. No loss of appetite.   Review of Systems  Positive:  Negative:   Physical Exam  BP 116/79   Pulse 97   Temp 98.4 F (36.9 C) (Oral)   Resp 16   SpO2 98%  Gen:   Awake, no distress   Resp:  Normal effort  MSK:   Moves extremities without difficulty  Other:  Generalized abdominal discomfort   Medical Decision Making  Medically screening exam initiated at 4:32 PM.  Appropriate orders placed.  Abigail Erickson was informed that the remainder of the evaluation will be completed by another provider, this initial triage assessment does not replace that evaluation, and the importance of remaining in the ED until their evaluation is complete.     Jeannie Fend, PA-C 12/23/22 1635

## 2022-12-24 DIAGNOSIS — R1031 Right lower quadrant pain: Secondary | ICD-10-CM | POA: Diagnosis not present

## 2022-12-24 DIAGNOSIS — N83291 Other ovarian cyst, right side: Secondary | ICD-10-CM | POA: Diagnosis not present

## 2022-12-24 DIAGNOSIS — R197 Diarrhea, unspecified: Secondary | ICD-10-CM | POA: Diagnosis not present

## 2022-12-24 DIAGNOSIS — R110 Nausea: Secondary | ICD-10-CM | POA: Diagnosis not present

## 2022-12-24 DIAGNOSIS — N83201 Unspecified ovarian cyst, right side: Secondary | ICD-10-CM | POA: Diagnosis not present

## 2022-12-24 DIAGNOSIS — Z556 Problems related to health literacy: Secondary | ICD-10-CM | POA: Diagnosis not present

## 2022-12-24 NOTE — ED Notes (Signed)
No answer x 3. Pt not found in lobby nor surrounding areas

## 2022-12-24 NOTE — ED Notes (Signed)
No answer called 2x  

## 2023-01-15 DIAGNOSIS — R102 Pelvic and perineal pain: Secondary | ICD-10-CM | POA: Diagnosis not present

## 2023-01-15 DIAGNOSIS — N803C3 Endometriosis of bilateral uterosacral ligament(s), unspecified depth: Secondary | ICD-10-CM | POA: Diagnosis not present

## 2023-01-15 DIAGNOSIS — R1030 Lower abdominal pain, unspecified: Secondary | ICD-10-CM | POA: Diagnosis not present

## 2023-01-15 DIAGNOSIS — F17200 Nicotine dependence, unspecified, uncomplicated: Secondary | ICD-10-CM | POA: Diagnosis not present

## 2023-01-15 DIAGNOSIS — N8301 Follicular cyst of right ovary: Secondary | ICD-10-CM | POA: Diagnosis not present

## 2023-01-15 DIAGNOSIS — N83201 Unspecified ovarian cyst, right side: Secondary | ICD-10-CM | POA: Diagnosis not present

## 2023-01-15 DIAGNOSIS — Z91040 Latex allergy status: Secondary | ICD-10-CM | POA: Diagnosis not present

## 2023-01-15 DIAGNOSIS — N808 Other endometriosis: Secondary | ICD-10-CM | POA: Diagnosis not present

## 2023-01-15 DIAGNOSIS — N80329 Endometriosis of the posterior cul-de-sac, unspecified depth: Secondary | ICD-10-CM | POA: Diagnosis not present

## 2023-01-15 DIAGNOSIS — N83291 Other ovarian cyst, right side: Secondary | ICD-10-CM | POA: Diagnosis not present

## 2023-02-02 DIAGNOSIS — Z87898 Personal history of other specified conditions: Secondary | ICD-10-CM | POA: Diagnosis not present

## 2023-03-29 DIAGNOSIS — N6314 Unspecified lump in the right breast, lower inner quadrant: Secondary | ICD-10-CM | POA: Diagnosis not present

## 2023-03-29 DIAGNOSIS — R110 Nausea: Secondary | ICD-10-CM | POA: Diagnosis not present

## 2023-03-29 DIAGNOSIS — D72829 Elevated white blood cell count, unspecified: Secondary | ICD-10-CM | POA: Diagnosis not present

## 2023-03-29 DIAGNOSIS — N644 Mastodynia: Secondary | ICD-10-CM | POA: Diagnosis not present

## 2023-03-29 DIAGNOSIS — N6489 Other specified disorders of breast: Secondary | ICD-10-CM | POA: Diagnosis not present

## 2023-03-29 DIAGNOSIS — Z803 Family history of malignant neoplasm of breast: Secondary | ICD-10-CM | POA: Diagnosis not present

## 2023-05-09 IMAGING — CT CT ABD-PELV W/ CM
2 of 4 series · 16 of 46 positions shown, 18 images · IV contrast (agent unspecified)
Comparison: None.

CLINICAL DATA: Right lower quadrant pain

EXAM:
CT ABDOMEN AND PELVIS WITH CONTRAST
TECHNIQUE: Multidetector CT imaging of the abdomen and pelvis was performed
using the standard protocol following bolus administration of
intravenous contrast.

[Series 3: abd/ pelvis 5.0 i30f 2 · axial · 0.94mm/px · z∈[+734,+1159]mm · 13 of 93 slices shown, 15 images]
[im 4/93  soft-tissue]
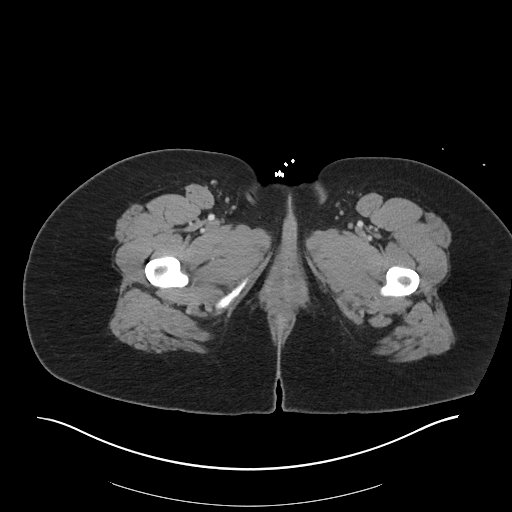
[im 4/93  bone]
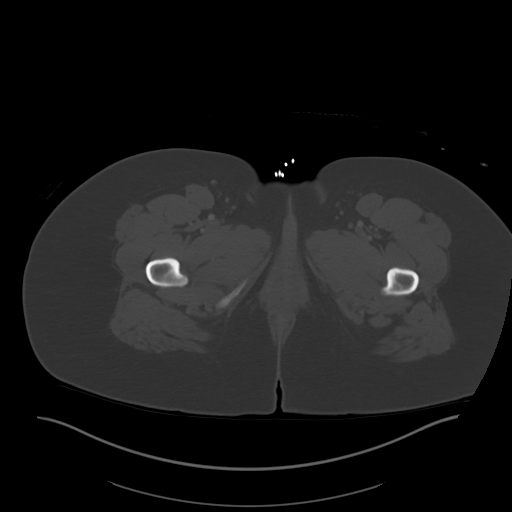
[im 12/93  soft-tissue]
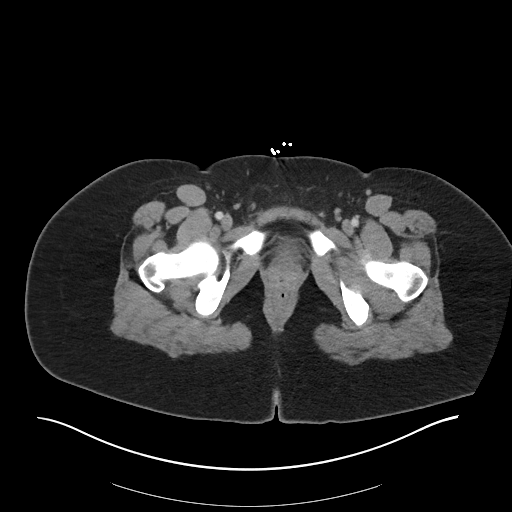
[im 20/93  soft-tissue]
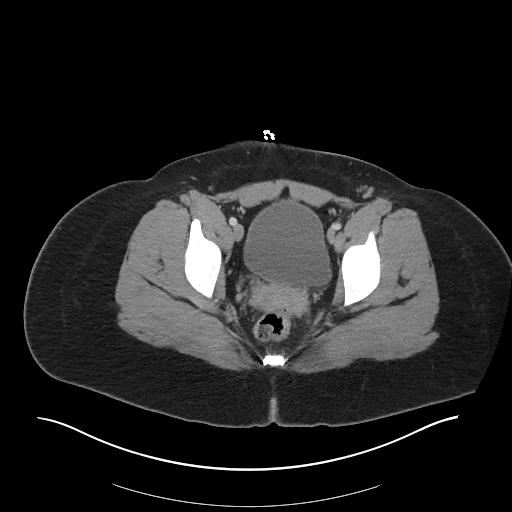
[im 27/93  soft-tissue]
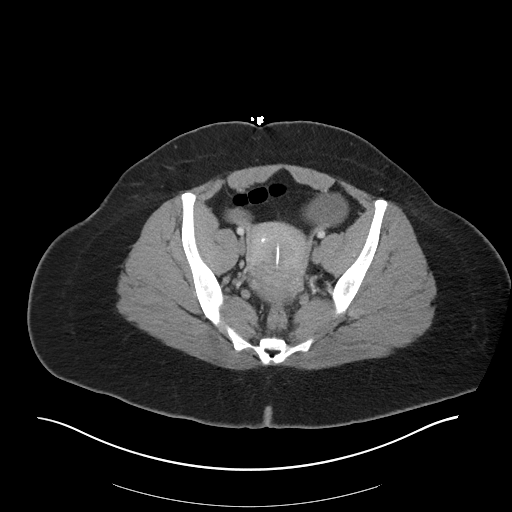
[im 31/93  soft-tissue]
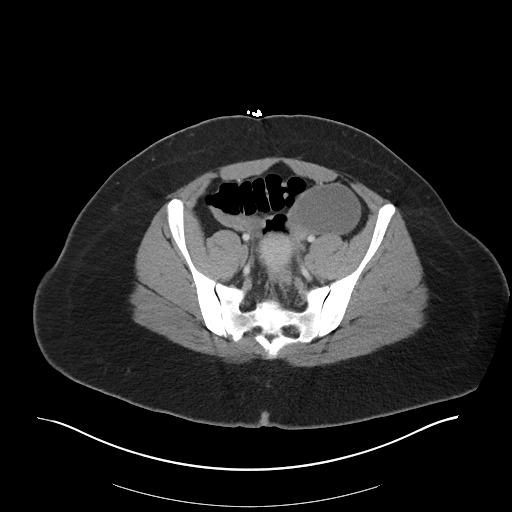
[im 39/93  soft-tissue]
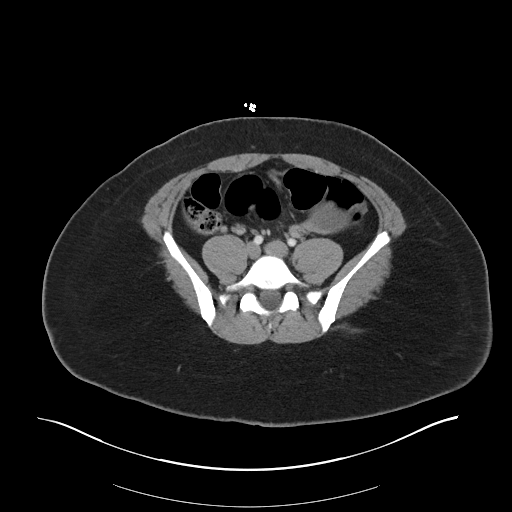
[im 47/93  soft-tissue]
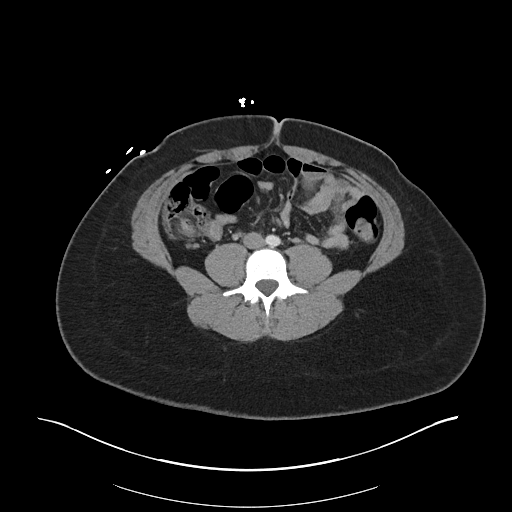
[im 54/93  soft-tissue]
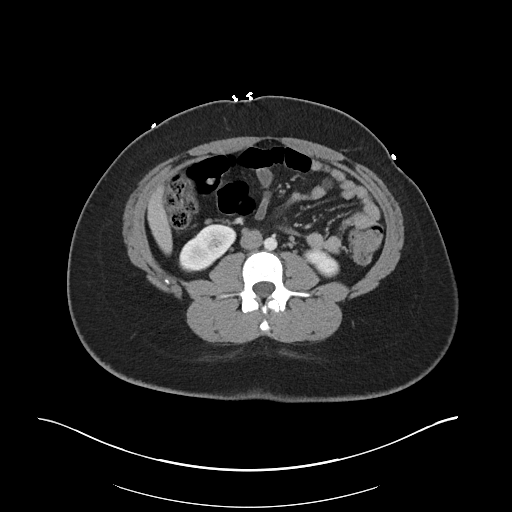
[im 62/93  soft-tissue]
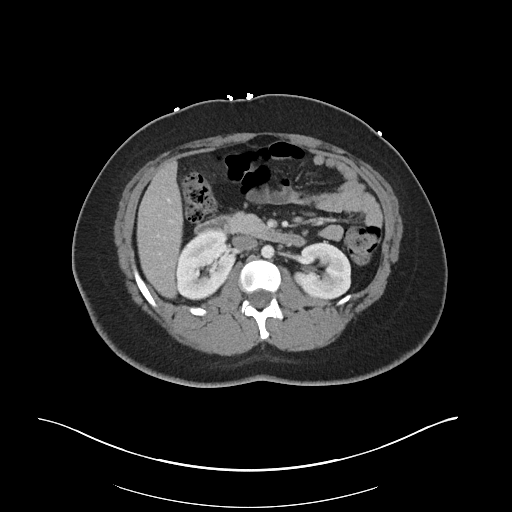
[im 62/93  bone]
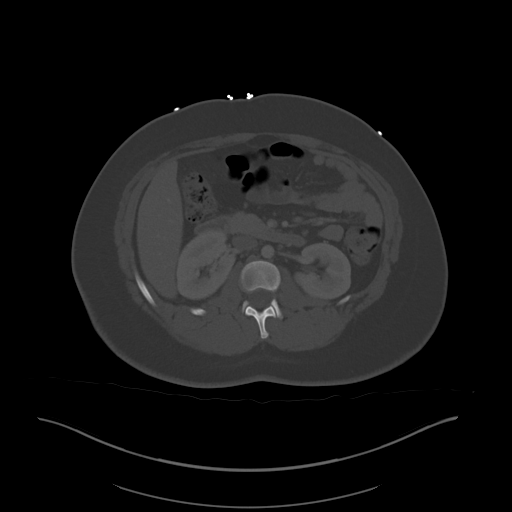
[im 66/93  soft-tissue]
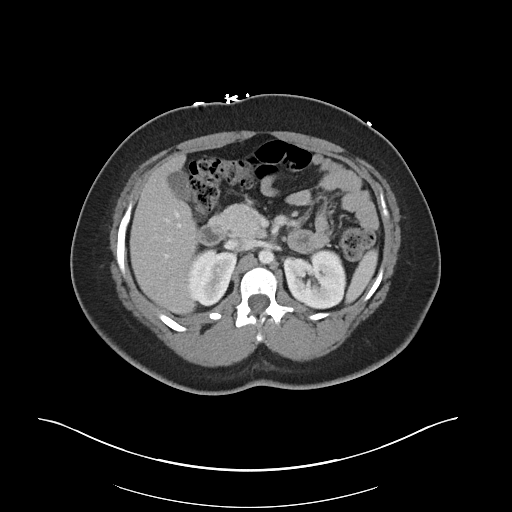
[im 73/93  soft-tissue]
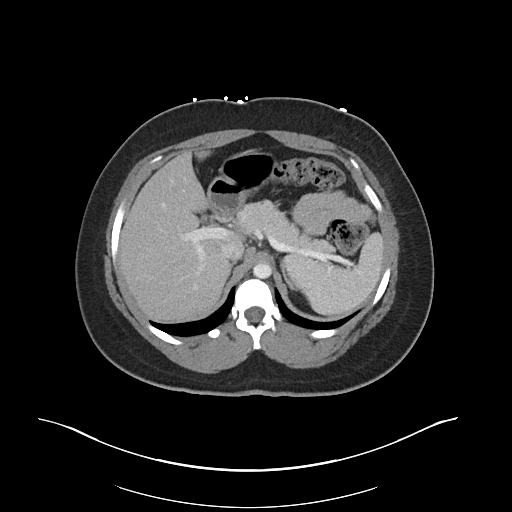
[im 81/93  soft-tissue]
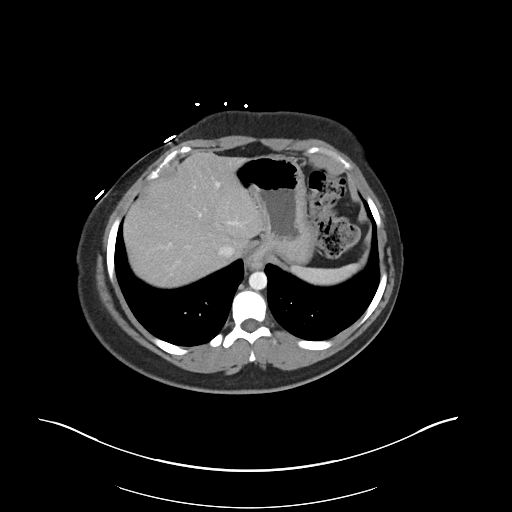
[im 89/93  soft-tissue]
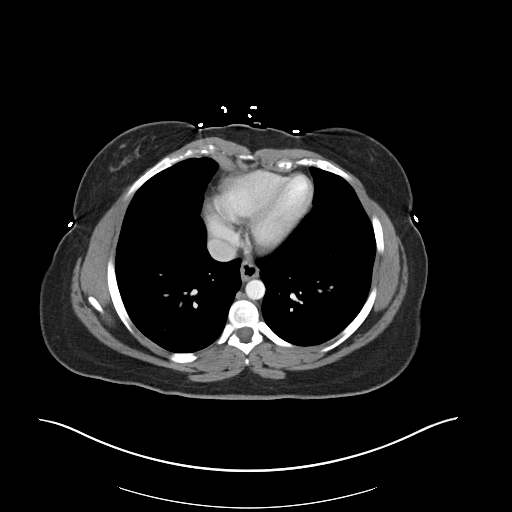

[Series 6: coronal soft tissue · coronal · 0.91mm/px · 3 of 101 slices shown]
[im 34/101  soft-tissue]
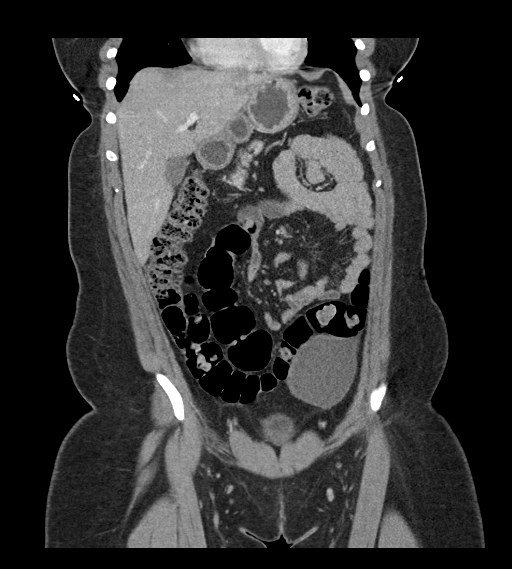
[im 45/101  soft-tissue]
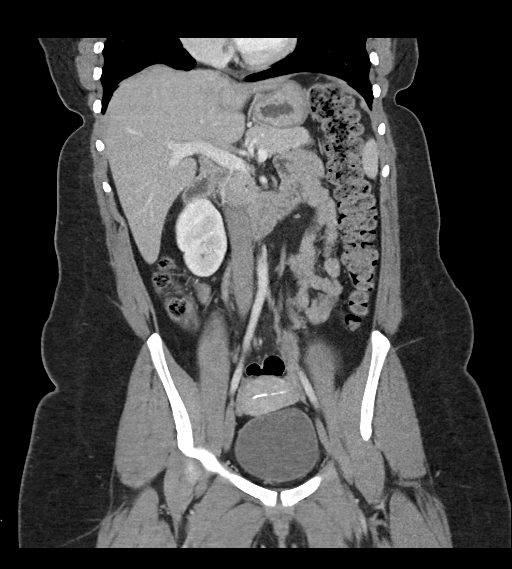
[im 56/101  soft-tissue]
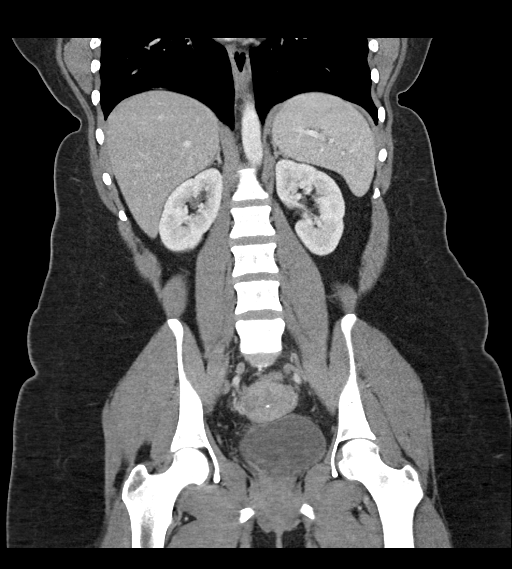

[16 of 46 positions shown; findings below may reference images not displayed]

RADIATION DOSE REDUCTION: This exam was performed according to the
departmental dose-optimization program which includes automated
exposure control, adjustment of the mA and/or kV according to
patient size and/or use of iterative reconstruction technique.

CONTRAST:  100mL OMNIPAQUE IOHEXOL 300 MG/ML  SOLN
FINDINGS: Lower chest: No acute abnormality.

Hepatobiliary: No solid liver abnormality is seen. No gallstones,
gallbladder wall thickening, or biliary dilatation.

Pancreas: Unremarkable. No pancreatic ductal dilatation or
surrounding inflammatory changes.

Spleen: Normal in size without significant abnormality.

Adrenals/Urinary Tract: Adrenal glands are unremarkable. Kidneys are
normal, without renal calculi, solid lesion, or hydronephrosis.
Bladder is unremarkable.

Stomach/Bowel: Stomach is within normal limits. Appendix appears
normal. No evidence of bowel wall thickening, distention, or
inflammatory changes.

Vascular/Lymphatic: No significant vascular findings are present. No
enlarged abdominal or pelvic lymph nodes.

Reproductive: IUD present in the fundal endometrial cavity. Left
ovarian cyst measuring 6.5 cm (series 3, image 60). Right ovarian
cyst measuring 3.6 cm (series 3, image 66).

Other: No abdominal wall hernia or abnormality. No ascites.

Musculoskeletal: No acute or significant osseous findings.
IMPRESSION: 1. No acute CT findings of the abdomen or pelvis to explain right
lower quadrant pain. Normal appendix.
2. Bilateral ovarian cysts, measuring up to 6.5 cm on the left and
3.6 cm on the right. Although benign, these may be symptomatic due
to size. Note that any enlarged ovary may serve as a nidus of
torsion in the setting of acutely referable pain. Follow-up by US is
recommended in 3-6 months. Note: This recommendation does not apply
to premenarchal patients and to those with increased risk (genetic,
family history, elevated tumor markers or other high-risk factors)
of ovarian cancer. Reference: JACR [DATE]):[id number]
3. IUD present in the fundal endometrial cavity.
